# Patient Record
Sex: Male | Born: 1965
Health system: Southern US, Community
[De-identification: ages and names within clinical notes are randomized; demographics above are authoritative.]

---

## 2019-07-21 ENCOUNTER — Emergency Department (HOSPITAL_COMMUNITY): Payer: No Typology Code available for payment source

## 2019-07-21 ENCOUNTER — Other Ambulatory Visit: Payer: Self-pay

## 2019-07-21 ENCOUNTER — Inpatient Hospital Stay (HOSPITAL_COMMUNITY)
Admission: EM | Admit: 2019-07-21 | Discharge: 2019-08-05 | DRG: 163 | Disposition: A | Discharge status: Home / Self Care | Payer: No Typology Code available for payment source | Attending: Cardiothoracic Surgery | Admitting: Cardiothoracic Surgery

## 2019-07-21 ENCOUNTER — Inpatient Hospital Stay (HOSPITAL_COMMUNITY): Payer: No Typology Code available for payment source

## 2019-07-21 DIAGNOSIS — D72829 Elevated white blood cell count, unspecified: Secondary | ICD-10-CM | POA: Diagnosis not present

## 2019-07-21 DIAGNOSIS — J90 Pleural effusion, not elsewhere classified: Secondary | ICD-10-CM | POA: Diagnosis not present

## 2019-07-21 DIAGNOSIS — J930 Spontaneous tension pneumothorax: Principal | ICD-10-CM | POA: Diagnosis present

## 2019-07-21 DIAGNOSIS — I10 Essential (primary) hypertension: Secondary | ICD-10-CM | POA: Diagnosis present

## 2019-07-21 DIAGNOSIS — J850 Gangrene and necrosis of lung: Secondary | ICD-10-CM | POA: Diagnosis present

## 2019-07-21 DIAGNOSIS — J9311 Primary spontaneous pneumothorax: Secondary | ICD-10-CM | POA: Diagnosis not present

## 2019-07-21 DIAGNOSIS — F17200 Nicotine dependence, unspecified, uncomplicated: Secondary | ICD-10-CM | POA: Diagnosis present

## 2019-07-21 DIAGNOSIS — J948 Other specified pleural conditions: Secondary | ICD-10-CM

## 2019-07-21 DIAGNOSIS — J9383 Other pneumothorax: Secondary | ICD-10-CM | POA: Diagnosis not present

## 2019-07-21 DIAGNOSIS — Z79899 Other long term (current) drug therapy: Secondary | ICD-10-CM | POA: Diagnosis not present

## 2019-07-21 DIAGNOSIS — Z20822 Contact with and (suspected) exposure to covid-19: Secondary | ICD-10-CM | POA: Diagnosis present

## 2019-07-21 DIAGNOSIS — Z23 Encounter for immunization: Secondary | ICD-10-CM

## 2019-07-21 DIAGNOSIS — I480 Paroxysmal atrial fibrillation: Secondary | ICD-10-CM | POA: Diagnosis present

## 2019-07-21 DIAGNOSIS — J852 Abscess of lung without pneumonia: Secondary | ICD-10-CM

## 2019-07-21 DIAGNOSIS — Z09 Encounter for follow-up examination after completed treatment for conditions other than malignant neoplasm: Secondary | ICD-10-CM

## 2019-07-21 DIAGNOSIS — R079 Chest pain, unspecified: Secondary | ICD-10-CM

## 2019-07-21 DIAGNOSIS — G8929 Other chronic pain: Secondary | ICD-10-CM | POA: Diagnosis not present

## 2019-07-21 DIAGNOSIS — J9382 Other air leak: Secondary | ICD-10-CM | POA: Diagnosis not present

## 2019-07-21 DIAGNOSIS — I2510 Atherosclerotic heart disease of native coronary artery without angina pectoris: Secondary | ICD-10-CM | POA: Diagnosis not present

## 2019-07-21 DIAGNOSIS — J432 Centrilobular emphysema: Secondary | ICD-10-CM | POA: Diagnosis not present

## 2019-07-21 DIAGNOSIS — Z720 Tobacco use: Secondary | ICD-10-CM | POA: Diagnosis not present

## 2019-07-21 DIAGNOSIS — J939 Pneumothorax, unspecified: Secondary | ICD-10-CM | POA: Diagnosis present

## 2019-07-21 LAB — CBC WITH DIFFERENTIAL/PLATELET
Abs Immature Granulocytes: 0.06 10*3/uL (ref 0.00–0.07)
Basophils Absolute: 0 10*3/uL (ref 0.0–0.1)
Basophils Relative: 0 %
Eosinophils Absolute: 0 10*3/uL (ref 0.0–0.5)
Eosinophils Relative: 0 %
HCT: 45.9 % (ref 39.0–52.0)
Hemoglobin: 14.9 g/dL (ref 13.0–17.0)
Immature Granulocytes: 0 %
Lymphocytes Relative: 8 %
Lymphs Abs: 1.2 10*3/uL (ref 0.7–4.0)
MCH: 31.7 pg (ref 26.0–34.0)
MCHC: 32.5 g/dL (ref 30.0–36.0)
MCV: 97.7 fL (ref 80.0–100.0)
Monocytes Absolute: 0.8 10*3/uL (ref 0.1–1.0)
Monocytes Relative: 5 %
Neutro Abs: 12.5 10*3/uL — ABNORMAL HIGH (ref 1.7–7.7)
Neutrophils Relative %: 87 %
Platelets: 74 10*3/uL — ABNORMAL LOW (ref 150–400)
RBC: 4.7 MIL/uL (ref 4.22–5.81)
RDW: 11.9 % (ref 11.5–15.5)
WBC: 14.6 10*3/uL — ABNORMAL HIGH (ref 4.0–10.5)
nRBC: 0 % (ref 0.0–0.2)

## 2019-07-21 LAB — URINALYSIS, ROUTINE W REFLEX MICROSCOPIC
Bilirubin Urine: NEGATIVE
Glucose, UA: NEGATIVE mg/dL
Hgb urine dipstick: NEGATIVE
Ketones, ur: NEGATIVE mg/dL
Leukocytes,Ua: NEGATIVE
Nitrite: NEGATIVE
Protein, ur: NEGATIVE mg/dL
Specific Gravity, Urine: 1.016 (ref 1.005–1.030)
pH: 5 (ref 5.0–8.0)

## 2019-07-21 LAB — CBC
HCT: 44.9 % (ref 39.0–52.0)
Hemoglobin: 15 g/dL (ref 13.0–17.0)
MCH: 32.2 pg (ref 26.0–34.0)
MCHC: 33.4 g/dL (ref 30.0–36.0)
MCV: 96.4 fL (ref 80.0–100.0)
Platelets: 199 K/uL (ref 150–400)
RBC: 4.66 MIL/uL (ref 4.22–5.81)
RDW: 12 % (ref 11.5–15.5)
WBC: 17 K/uL — ABNORMAL HIGH (ref 4.0–10.5)
nRBC: 0 % (ref 0.0–0.2)

## 2019-07-21 LAB — I-STAT CHEM 8, ED
BUN: 13 mg/dL (ref 6–20)
Calcium, Ion: 1.14 mmol/L — ABNORMAL LOW (ref 1.15–1.40)
Chloride: 106 mmol/L (ref 98–111)
Creatinine, Ser: 0.9 mg/dL (ref 0.61–1.24)
Glucose, Bld: 97 mg/dL (ref 70–99)
HCT: 45 % (ref 39.0–52.0)
Hemoglobin: 15.3 g/dL (ref 13.0–17.0)
Potassium: 4.3 mmol/L (ref 3.5–5.1)
Sodium: 140 mmol/L (ref 135–145)
TCO2: 29 mmol/L (ref 22–32)

## 2019-07-21 LAB — HIV ANTIBODY (ROUTINE TESTING W REFLEX): HIV Screen 4th Generation wRfx: NONREACTIVE

## 2019-07-21 LAB — SARS CORONAVIRUS 2 (TAT 6-24 HRS): SARS Coronavirus 2: NEGATIVE

## 2019-07-21 MED ORDER — HYDROCODONE-ACETAMINOPHEN 5-325 MG PO TABS
1.0000 | ORAL_TABLET | ORAL | Status: DC | PRN
  Administered 2019-07-21 (×2): 2 via ORAL
  Filled 2019-07-21 (×2): qty 2

## 2019-07-21 MED ORDER — ONDANSETRON HCL 4 MG/2ML IJ SOLN
4.0000 mg | Freq: Once | INTRAMUSCULAR | Status: AC
  Administered 2019-07-21: 4 mg via INTRAVENOUS
  Filled 2019-07-21: qty 2

## 2019-07-21 MED ORDER — DEXTROSE IN LACTATED RINGERS 5 % IV SOLN
INTRAVENOUS | Status: DC
  Administered 2019-07-22: 75 mL/h via INTRAVENOUS

## 2019-07-21 MED ORDER — LIDOCAINE HCL (PF) 1 % IJ SOLN
INTRAMUSCULAR | Status: AC
  Filled 2019-07-21: qty 5

## 2019-07-21 MED ORDER — MORPHINE SULFATE (PF) 2 MG/ML IV SOLN
2.0000 mg | INTRAVENOUS | Status: DC | PRN
  Administered 2019-07-21 – 2019-07-22 (×10): 4 mg via INTRAVENOUS
  Administered 2019-07-23: 2 mg via INTRAVENOUS
  Administered 2019-07-23: 4 mg via INTRAVENOUS
  Administered 2019-07-24 – 2019-07-30 (×11): 2 mg via INTRAVENOUS
  Administered 2019-07-30: 4 mg via INTRAVENOUS
  Administered 2019-08-01 – 2019-08-03 (×3): 2 mg via INTRAVENOUS
  Filled 2019-07-21: qty 1
  Filled 2019-07-21: qty 2
  Filled 2019-07-21: qty 1
  Filled 2019-07-21: qty 2
  Filled 2019-07-21 (×2): qty 1
  Filled 2019-07-21: qty 2
  Filled 2019-07-21 (×2): qty 1
  Filled 2019-07-21: qty 2
  Filled 2019-07-21 (×2): qty 1
  Filled 2019-07-21: qty 2
  Filled 2019-07-21: qty 1
  Filled 2019-07-21: qty 2
  Filled 2019-07-21 (×6): qty 1
  Filled 2019-07-21 (×3): qty 2
  Filled 2019-07-21 (×2): qty 1
  Filled 2019-07-21 (×3): qty 2

## 2019-07-21 MED ORDER — FENTANYL CITRATE (PF) 100 MCG/2ML IJ SOLN
INTRAMUSCULAR | Status: AC
  Administered 2019-07-21: 100 ug
  Filled 2019-07-21: qty 2

## 2019-07-21 MED ORDER — HYDROMORPHONE HCL 1 MG/ML IJ SOLN
1.0000 mg | Freq: Once | INTRAMUSCULAR | Status: AC
  Administered 2019-07-21: 1 mg via INTRAVENOUS
  Filled 2019-07-21: qty 1

## 2019-07-21 MED ORDER — LIDOCAINE-EPINEPHRINE (PF) 2 %-1:200000 IJ SOLN
INTRAMUSCULAR | Status: AC
  Filled 2019-07-21: qty 20

## 2019-07-21 NOTE — Progress Notes (Addendum)
Patient admitted to room 2W03 s/p spontaneous rignt pneumohemothorax resulting in placement of #14 chest tube to -20 cm H2O.  Patient introduced to staff and to unit routine.  Initial assessment completed and CHG bath performed.  Call bell left within reach and bed in low position with bed alarm activated for safety.

## 2019-07-21 NOTE — ED Provider Notes (Signed)
[facility] EMERGENCY DEPARTMENT Provider Note   CSN: [id number] Arrival date & time:        History Chief Complaint  Patient presents with  . Back Pain    Caleb Young. is a 54 y.o. male.  The history is provided by the patient. No language interpreter was used.  Back Pain    54 year old male brought here via EMS from home for evaluation of flank pain.  Patient report acute onset of severe pain to his right side of his back radiates towards the shoulder blade and down to the right side of his body.  Pain is sharp, severe, nothing seems to make it better or worse.  No report of fever chills no lightheadedness or dizziness no significant chest pain no dysuria or hematuria.  States he is hurting everywhere from right side starting from his right shoulder down to his right lower abdomen.  Endorsed feeling like he is choking.  Denies any recent injury.  Denies any focal numbness or focal weakness.  No prior history of kidney stones but did report having history of cyst in his kidney.  No history of AAA.  He received 100 mcg of fentanyl prior to arrival without adequate relief.  No past medical history on file.  There are no problems to display for this patient.   The histories are not reviewed yet. Please review them in the "History" navigator section and refresh this SmartLink.     No family history on file.  Social History   Tobacco Use  . Smoking status: Not on file  Substance Use Topics  . Alcohol use: Not on file  . Drug use: Not on file    Home Medications Prior to Admission medications   Not on File    Allergies    Codeine and Gabapentin  Review of Systems   Review of Systems  Musculoskeletal: Positive for back pain.  All other systems reviewed and are negative.   Physical Exam Updated Vital Signs BP (!) 149/110 (BP Location: Right Arm)   Pulse (!) 110   Temp 98.2 F (36.8 C) (Tympanic)   Resp 20   SpO2 93%   Physical  Exam Vitals and nursing note reviewed.  Constitutional:      General: He is in acute distress (Patient appears very uncomfortable, writhing around in bed moaning.).     Appearance: He is well-developed.  HENT:     Head: Atraumatic.  Eyes:     Conjunctiva/sclera: Conjunctivae normal.  Cardiovascular:     Rate and Rhythm: Normal rate and regular rhythm.     Pulses: Normal pulses.     Heart sounds: Normal heart sounds.  Pulmonary:     Breath sounds: No wheezing, rhonchi or rales.     Comments: Tachypnea with decreased breath sounds right lung compared to left. Abdominal:     Palpations: Abdomen is soft.     Tenderness: There is abdominal tenderness (Diffuse abdominal tenderness without focal point tenderness.). There is right CVA tenderness. There is no left CVA tenderness.  Musculoskeletal:        General: Normal range of motion.     Cervical back: Neck supple.     Comments: Moving all 4 extremities.  Skin:    Capillary Refill: Capillary refill takes less than 2 seconds.     Findings: No rash.  Neurological:     Mental Status: He is alert and oriented to person, place, and time.  Psychiatric:  Mood and Affect: Mood normal.     ED Results / Procedures / Treatments   Labs (all labs ordered are listed, but only abnormal results are displayed) Labs Reviewed  CBC WITH DIFFERENTIAL/PLATELET - Abnormal; Notable for the following components:      Result Value   WBC 14.6 (*)    Platelets 74 (*)    Neutro Abs 12.5 (*)    All other components within normal limits  I-STAT CHEM 8, ED - Abnormal; Notable for the following components:   Calcium, Ion 1.14 (*)    All other components within normal limits  URINALYSIS, ROUTINE W REFLEX MICROSCOPIC    EKG EKG Interpretation  Date/Time:  Wednesday July 21 2019 10:32:01 EST Ventricular Rate:  110 PR Interval:    QRS Duration: 108 QT Interval:  330 QTC Calculation: 443 R Axis:   83 Text Interpretation: Sinus tachycardia  Nonspecific T abnormalities, lateral leads No old tracing to compare Confirmed by Dorie Rank [phone]) on 07/21/2019 10:36:18 AM   Radiology CT Renal Stone Study  Result Date: 07/21/2019 CLINICAL DATA:  54 year old male with history of flank pain. Suspected kidney stone. EXAM: CT ABDOMEN AND PELVIS WITHOUT CONTRAST TECHNIQUE: Multidetector CT imaging of the abdomen and pelvis was performed following the standard protocol without IV contrast. COMPARISON:  No priors. FINDINGS: Lower chest: Large right pneumothorax with near complete collapse of the visualized right lung. Trace volume of right-sided pleural fluid lying dependently. There may be some mild mass effect on cardiomediastinal structures which appears slightly shifted to the left. Aortic atherosclerosis. Calcified atherosclerotic plaque also noted in the left circumflex coronary artery. Hepatobiliary: No definite suspicious cystic or solid hepatic lesions are confidently identified on today's noncontrast CT examination. Atrophy of the left lobe of the liver. Calcified granuloma in the central liver. Unenhanced appearance of the gallbladder is unremarkable. Pancreas: No definite pancreatic mass or peripancreatic fluid collections or inflammatory changes are noted on today's noncontrast CT examination. Spleen: Unremarkable. Adrenals/Urinary Tract: No calculi are identified within the collecting system of either kidney, along the course of either ureter, or within the lumen of the urinary bladder. No hydroureteronephrosis. 3.3 x 2.8 x 3.1 cm low-attenuation lesion in the interpolar region of the right kidney, incompletely characterized on today's non-contrast CT examination, but statistically likely to represent a cyst. Unenhanced appearance of the left kidney and bilateral adrenal glands is unremarkable. Unenhanced appearance of the urinary bladder is normal. Stomach/Bowel: Unenhanced appearance of the stomach is normal. No pathologic dilatation of small bowel  or colon. Normal appendix. Vascular/Lymphatic: Aortic atherosclerosis. No lymphadenopathy noted in the abdomen or pelvis. Reproductive: Prostate gland and seminal vesicles are unremarkable in appearance. Other: No significant volume of ascites.  No pneumoperitoneum. Musculoskeletal: There are no aggressive appearing lytic or blastic lesions noted in the visualized portions of the skeleton. IMPRESSION: 1. Large right-sided hydropneumothorax (large pneumothorax with trace volume of fluid) which appears likely under tension. 2. No acute findings are noted in the abdomen or pelvis to account for the patient's symptoms. Specifically, no urinary tract calculi and no findings of urinary tract obstruction are noted at this time. 3. Aortic atherosclerosis, in addition to at least left circumflex coronary artery disease. Please note that although the presence of coronary artery calcium documents the presence of coronary artery disease, the severity of this disease and any potential stenosis cannot be assessed on this non-gated CT examination. Assessment for potential risk factor modification, dietary therapy or pharmacologic therapy may be warranted, if clinically indicated. Critical Value/emergent  results were called by telephone at the time of interpretation on 07/21/2019 at 11:33 am to provider [facility], who verbally acknowledged these results. Electronically Signed   By: Trudie Reed M.D.   On: 07/21/2019 11:33    Procedures .Critical Care Performed by: Fayrene Helper, PA-C Authorized by: Fayrene Helper, PA-C   Critical care provider statement:    Critical care time (minutes):  31   Critical care was time spent personally by me on the following activities:  Discussions with consultants, evaluation of patient's response to treatment, examination of patient, ordering and performing treatments and interventions, ordering and review of laboratory studies, ordering and review of radiographic studies, pulse oximetry,  re-evaluation of patient's condition, obtaining history from patient or surrogate and review of old charts   (including critical care time)  EMERGENCY DEPARTMENT [place] FAST EXAM "Limited Ultrasound of the Abdomen and Pericardium" (FAST Exam).   INDICATIONS:No abdominal aortic aneurysm Multiple views of the abdomen and pericardium are obtained with a multi-frequency probe.  PERFORMED BY: Myself IMAGES ARCHIVED?: Yes LIMITATIONS:  Emergent procedure INTERPRETATION:  No abdominal free fluid and No pericardial effusion   EMERGENCY DEPARTMENT ULTRASOUND  Study: Limited Retroperitoneal Ultrasound of the Abdominal Aorta.  INDICATIONS:Abdominal pain Multiple views of the abdominal aorta were obtained in real-time from the diaphragmatic hiatus to the aortic bifurcation in transverse planes with a multi-frequency probe.  PERFORMED BY: Myself IMAGES ARCHIVED?: Yes LIMITATIONS:  Abdominal pain INTERPRETATION:  No abdominal aortic aneurysm      Medications Ordered in ED Medications  lidocaine (PF) (XYLOCAINE) 1 % injection (has no administration in time range)  lidocaine-EPINEPHrine (XYLOCAINE W/EPI) 2 %-1:200000 (PF) injection (has no administration in time range)  HYDROmorphone (DILAUDID) injection 1 mg (1 mg Intravenous Given 07/21/19 1049)  ondansetron (ZOFRAN) injection 4 mg (4 mg Intravenous Given 07/21/19 1049)  HYDROmorphone (DILAUDID) injection 1 mg (1 mg Intravenous Given 07/21/19 1119)  fentaNYL (SUBLIMAZE) 100 MCG/2ML injection (100 mcg  Given 07/21/19 1201)    ED Course  I have reviewed the triage vital signs and the nursing notes.  Pertinent labs & imaging results that were available during my care of the patient were reviewed by me and considered in my medical decision making (see chart for details).    MDM Rules/Calculators/A&P                      BP (!) 149/110 (BP Location: Right Arm)   Pulse (!) 110   Temp 98.2 F (36.8 C) (Tympanic)   Resp 20   SpO2 93%   Final  Clinical Impression(s) / ED Diagnoses Final diagnoses:  Spontaneous tension pneumothorax    Rx / DC Orders ED Discharge Orders    None     10:42 AM Patient here with acute onset of right flank pain suggestive of potential kidney stone.  Appears very uncomfortable, will provide pain medication.  Will check UA, obtain CT renal stone study.  CAre discussed with DR. Knapp.  He does have diminished lung sounds, will appreciate.  11:00 AM With exquisite abdominal tenderness radiates to his shoulder, I perform fast ultrasound as well as evaluate for aorta without any evidence of free fluid and no obvious AAA.  CT renal stone study obtained.  Patient required multiple doses of pain medication for symptomatic relief.  12:07 PM CT scan shows large right-sided hydropneumothorax suggestive of tension pneumothorax.  No renal stone noted.  This was probably communicated with Dr. Lynelle Doctor, as well as critical care who agrees  to admit patient.  Critical care specialist will also insert chest tube for therapeutic relief.  Suspect this is likely a spontaneous pneumothorax from a ruptured bleb.  Patient does have history of tobacco use.  Patient felt much better after insertion of chest tube.  He will be admitted to critical care.  covid-19 screening test ordered.   Caleb Young. was evaluated in Emergency Department on 07/21/2019 for the symptoms described in the history of present illness. He was evaluated in the context of the global COVID-19 pandemic, which necessitated consideration that the patient might be at risk for infection with the SARS-CoV-2 virus that causes COVID-19. Institutional protocols and algorithms that pertain to the evaluation of patients at risk for COVID-19 are in a state of rapid change based on information released by regulatory bodies including the CDC and federal and state organizations. These policies and algorithms were followed during the patient's care in the ED.    Fayrene Helper, PA-C 07/28/19 1937    Linwood Dibbles, MD 07/29/19 [phone]

## 2019-07-21 NOTE — Procedures (Signed)
Chest Tube Insertion Procedure Note  Indications:  Clinically significant Pneumothorax  Pre-operative Diagnosis: Pneumothorax  Post-operative Diagnosis: Pneumothorax  Procedure Details  Informed consent was obtained for the procedure, including sedation.  Risks of lung perforation, hemorrhage, arrhythmia, and adverse drug reaction were discussed.   After sterile skin prep, using standard technique, a 14 French tube was placed in the right lateral 5th rib space.  Findings: Gush of air, small amount of blood.  Estimated Blood Loss:  Minimal         Specimens:  None.              Complications:  None; patient tolerated the procedure well.         Disposition: SDU.         Condition: stable.   Rutherford Guys, [place] [facility] 07/21/2019, 12:14 PM    Attending Attestation: I was present for the entire procedure.

## 2019-07-21 NOTE — ED Provider Notes (Signed)
Medical screening examination/treatment/procedure(s) were conducted as a shared visit with non-physician practitioner(s) and myself.  I personally evaluated the patient during the encounter.  EKG Interpretation  Date/Time:  Wednesday July 21 2019 10:32:01 EST Ventricular Rate:  110 PR Interval:    QRS Duration: 108 QT Interval:  330 QTC Calculation: 443 R Axis:   83 Text Interpretation: Sinus tachycardia Nonspecific T abnormalities, lateral leads No old tracing to compare Confirmed by Linwood Dibbles [phone]) on 07/21/2019 10:36:18 AM  Pt presents with acute flank pain.  Pt has noted some pain in his chest.  On exam, decreased breath sounds on the right.  CT scan shows a pneumothorax.  CT findings concerning for tension physiology.  Pt is tachycardic but not hypotensive.  Will hold off on needle decompression at this time and proceed with chest tube.  Discussed with PCCM  Who will manage pt, place chest tube.  Equipment is at the bedside.  Plan discussed with patient.    Linwood Dibbles, MD 07/21/19 1146

## 2019-07-21 NOTE — H&P (Signed)
NAME:  Caleb Overbeck., MRN:  [id number], DOB:  [birth year], LOS: 0 ADMISSION DATE:  07/21/2019, CONSULTATION DATE:  3/3 REFERRING MD:  Tomi Bamberger, CHIEF COMPLAINT:  Tension PTX   Brief History   54 year old male admitted 3/3 w/ right tension ptx.   History of present illness   This is a 54 year old white male who presented to the emergency room In the a.m. of 3/3 with chief complaint of severe right back pain migrating up through the shoulder blades.  On exam he had decreased breath sounds on the right. Dx eval c/w tension PTX. PCCM asked to eval.  Past Medical History  Smoker   Significant Hospital Events   3/3 admitted small bore CT placed in right  Consults:    Procedures:  Right lateral chest tube 3/3>>>  Significant Diagnostic Tests:   zT chest 3/3 1. Large right-sided hydropneumothorax (large pneumothorax with trace volume of fluid) which appears likely under tension.2. No acute findings are noted in the abdomen or pelvis to account for the patient's symptoms. Specifically, no urinary tract calculi and no findings of urinary tract obstruction are noted at this time.3. Aortic atherosclerosis, in addition to at least left circumflex coronary artery disease. Please note that although the presence of coronary artery calcium documents the presence of coronary artery disease, the severity of this disease and any potential stenosis cannot be assessed on this non-gated CT examination Micro Data:   Antimicrobials:    Interim history/subjective:  Right sided back and flank pain   Objective   Blood pressure 134/86, pulse (Abnormal) 121, temperature 98.2 F (36.8 C), temperature source Tympanic, resp. rate (Abnormal) 22, SpO2 93 %.       No intake or output data in the 24 hours ending 07/21/19 1153 There were no vitals filed for this visit.  Examination: General: 54 year old white male was in acute distress  HENT: NCAT no JVD MMM Lungs: decreased on right + accessory use   Cardiovascular: tachy RRR  Abdomen: soft not tender  Extremities: warm and dry no edema  Neuro: awake and alert  GU: due to void   Resolved Hospital Problem list     Assessment & Plan:  Acute spontaneous right ptx  -no evidence of acute bronchitis at this point or other etiology Plan CT to sxn Serial CXR Analgesia  Tobacco abuse Plan Smoking cessation  Mild leukocytosis Plan Trend fever curve   Best practice:  Diet: reg Pain/Anxiety/Delirium protocol (if indicated): PRN analgesia  VAP protocol (if indicated): NA DVT prophylaxis:[place] heparin  GI prophylaxis: NA Glucose control: NA Mobility: OOB  Code Status: full code  Family Communication: pending  Disposition:  To progressive care unit  Labs   CBC: Recent Labs  Lab 07/21/19 1042 07/21/19 1102  WBC 14.6*  --   NEUTROABS 12.5*  --   HGB 14.9 15.3  HCT 45.9 45.0  MCV 97.7  --   PLT 74*  --     Basic Metabolic Panel: Recent Labs  Lab 07/21/19 1102  NA 140  K 4.3  CL 106  GLUCOSE 97  BUN 13  CREATININE 0.90   GFR: CrCl cannot be calculated (Unknown ideal weight.). Recent Labs  Lab 07/21/19 1042  WBC 14.6*    Liver Function Tests: No results for input(s): AST, ALT, ALKPHOS, BILITOT, PROT, ALBUMIN in the last 168 hours. No results for input(s): LIPASE, AMYLASE in the last 168 hours. No results for input(s): AMMONIA in the last 168 hours.  ABG  Component Value Date/Time   TCO2 29 07/21/2019 1102     Coagulation Profile: No results for input(s): INR, PROTIME in the last 168 hours.  Cardiac Enzymes: No results for input(s): CKTOTAL, CKMB, CKMBINDEX, TROPONINI in the last 168 hours.  HbA1C: No results found for: HGBA1C  CBG: No results for input(s): GLUCAP in the last 168 hours.  Review of Systems:   Review of Systems  Constitutional: Negative.   HENT: Negative.   Eyes: Negative.   Respiratory: Positive for shortness of breath.   Cardiovascular: Positive for chest pain.   Gastrointestinal: Negative.   Genitourinary: Negative.   Musculoskeletal: Positive for back pain.  Skin: Negative.   Neurological: Negative.   Endo/Heme/Allergies: Negative.   Psychiatric/Behavioral: Negative.      Past Medical History  He,  has no past medical history on file.   Surgical History      Social History     Smoker  Family History   His family history is not on file.   Allergies Allergies  Allergen Reactions  . Codeine     "made my heart stop"  . Gabapentin Palpitations    tremors     Home Medications  Prior to Admission medications   Not on File     Critical care time: NA     Simonne Martinet ACNP-BC [facility] Pulmonary/Critical Care Pager # [phone] OR # [phone] if no answer

## 2019-07-21 NOTE — ED Triage Notes (Signed)
Pt woke up this morning at 0715 with severe back pain between his shoulder blades. Pain has continued, but on arrival is having pain on his right mid back "all the way around my body."

## 2019-07-21 NOTE — Progress Notes (Signed)
   07/21/19 1839  Vitals  Temp 98.6 F (37 C)  Temp Source Oral  BP 129/81  MAP (mmHg) 96  BP Location Left Arm  BP Method Automatic  Patient Position (if appropriate) Lying  Pulse Rate 100  Pulse Rate Source Dinamap  ECG Heart Rate (!) 101  Resp (!) 23  Level of Consciousness  Level of Consciousness Alert  Oxygen Therapy  SpO2 94 %  O2 Device Nasal Cannula  O2 Flow Rate (L/min) 2 L/min  MEWS Assessment  Is this an acute change? No   Aforementioned vital signs are typical for patient since arrival with severe thoracic pain right chest s/p chest tube insertion.  MD aware.  Medicated accordingly.

## 2019-07-22 ENCOUNTER — Inpatient Hospital Stay (HOSPITAL_COMMUNITY): Payer: No Typology Code available for payment source

## 2019-07-22 ENCOUNTER — Encounter (HOSPITAL_COMMUNITY): Payer: Self-pay | Admitting: Internal Medicine

## 2019-07-22 DIAGNOSIS — J930 Spontaneous tension pneumothorax: Secondary | ICD-10-CM

## 2019-07-22 DIAGNOSIS — Z720 Tobacco use: Secondary | ICD-10-CM

## 2019-07-22 DIAGNOSIS — J9383 Other pneumothorax: Secondary | ICD-10-CM

## 2019-07-22 DIAGNOSIS — J432 Centrilobular emphysema: Secondary | ICD-10-CM

## 2019-07-22 HISTORY — DX: Spontaneous tension pneumothorax: J93.0

## 2019-07-22 LAB — COMPREHENSIVE METABOLIC PANEL
ALT: 15 U/L (ref 0–44)
AST: 23 U/L (ref 15–41)
Albumin: 3.4 g/dL — ABNORMAL LOW (ref 3.5–5.0)
Alkaline Phosphatase: 49 U/L (ref 38–126)
Anion gap: 11 (ref 5–15)
BUN: 11 mg/dL (ref 6–20)
CO2: 23 mmol/L (ref 22–32)
Calcium: 8.7 mg/dL — ABNORMAL LOW (ref 8.9–10.3)
Chloride: 101 mmol/L (ref 98–111)
Creatinine, Ser: 0.78 mg/dL (ref 0.61–1.24)
GFR calc Af Amer: >60 mL/min (ref >60)
GFR calc non Af Amer: >60 mL/min (ref >60)
Glucose, Bld: 126 mg/dL — ABNORMAL HIGH (ref 70–99)
Potassium: 3.7 mmol/L (ref 3.5–5.1)
Sodium: 135 mmol/L (ref 135–145)
Total Bilirubin: 1.2 mg/dL (ref 0.3–1.2)
Total Protein: 6.1 g/dL — ABNORMAL LOW (ref 6.5–8.1)

## 2019-07-22 LAB — CBC
HCT: 41.4 % (ref 39.0–52.0)
Hemoglobin: 14.2 g/dL (ref 13.0–17.0)
MCH: 32.8 pg (ref 26.0–34.0)
MCHC: 34.3 g/dL (ref 30.0–36.0)
MCV: 95.6 fL (ref 80.0–100.0)
Platelets: 193 10*3/uL (ref 150–400)
RBC: 4.33 MIL/uL (ref 4.22–5.81)
RDW: 11.9 % (ref 11.5–15.5)
WBC: 21.1 10*3/uL — ABNORMAL HIGH (ref 4.0–10.5)
nRBC: 0 % (ref 0.0–0.2)

## 2019-07-22 MED ORDER — PANTOPRAZOLE SODIUM 40 MG PO TBEC
40.0000 mg | DELAYED_RELEASE_TABLET | Freq: Every day | ORAL | Status: AC
  Administered 2019-07-22 – 2019-07-24 (×2): 40 mg via ORAL
  Filled 2019-07-22 (×2): qty 1

## 2019-07-22 MED ORDER — KETOROLAC TROMETHAMINE 15 MG/ML IJ SOLN
15.0000 mg | Freq: Four times a day (QID) | INTRAMUSCULAR | Status: AC
  Administered 2019-07-22 – 2019-07-23 (×4): 15 mg via INTRAVENOUS
  Filled 2019-07-22 (×4): qty 1

## 2019-07-22 MED ORDER — SIMETHICONE 80 MG PO CHEW
80.0000 mg | CHEWABLE_TABLET | Freq: Four times a day (QID) | ORAL | Status: DC | PRN
  Administered 2019-07-22 – 2019-07-30 (×7): 80 mg via ORAL
  Filled 2019-07-22 (×7): qty 1

## 2019-07-22 MED ORDER — MORPHINE SULFATE (PF) 2 MG/ML IV SOLN: 2.0000 mg | Freq: Once | INTRAVENOUS | Status: DC

## 2019-07-22 MED ORDER — IBUPROFEN 600 MG PO TABS
600.0000 mg | ORAL_TABLET | Freq: Three times a day (TID) | ORAL | Status: DC
  Administered 2019-07-22: 600 mg via ORAL
  Filled 2019-07-22: qty 1

## 2019-07-22 MED ORDER — LIDOCAINE 5 % EX PTCH
1.0000 | MEDICATED_PATCH | CUTANEOUS | Status: DC
  Administered 2019-07-22 – 2019-08-04 (×12): 1 via TRANSDERMAL
  Filled 2019-07-22 (×12): qty 1

## 2019-07-22 MED ORDER — HYDROMORPHONE HCL 1 MG/ML IJ SOLN
1.0000 mg | Freq: Once | INTRAMUSCULAR | Status: AC
  Administered 2019-07-22: 1 mg via INTRAVENOUS
  Filled 2019-07-22: qty 1

## 2019-07-22 MED ORDER — IBUPROFEN 200 MG PO TABS: 400.0000 mg | ORAL_TABLET | Freq: Once | ORAL | Status: DC

## 2019-07-22 NOTE — TOC Initial Note (Signed)
Transition of Care (TOC) - Initial/Assessment Note    Patient Details  Name: Caleb Young. MRN: [id number] Date of Birth: 1965/10/11  Transition of Care [facility]) CM/SW Contact:    Kermit Balo, RN Phone Number: 07/22/2019, 2:25 PM  Clinical Narrative:                 [facility] [place]. His PCP: Dr Liliane Shi. [facility] his [facility]. At d/c medications will need to be filled either through [facility] or outpatient pharmacy if over the weekend as the [place] pharmacy is also closed over the weekend. [place]: [phone] or [phone] ext [phone] TOC following for d/c needs.   Expected Discharge Plan: Home/Self Care Barriers to Discharge: Continued Medical Work up   Patient Goals and CMS Choice        Expected Discharge Plan and Services Expected Discharge Plan: Home/Self Care   Discharge Planning Services: CM Consult   Living arrangements for the past 2 months: Mobile Home                                      Prior Living Arrangements/Services Living arrangements for the past 2 months: Mobile Home Lives with:: Spouse Patient language and need for interpreter reviewed:: Yes Do you feel safe going back to the place where you live?: Yes      Need for Family Participation in Patient Care: Yes (Comment) Care giver support system in place?: Yes (comment)(spouse)   Criminal Activity/Legal Involvement Pertinent to Current Situation/Hospitalization: No - Comment as needed  Activities of Daily Living      Permission Sought/Granted                  Emotional Assessment Appearance:: Appears stated age Attitude/Demeanor/Rapport: Engaged Affect (typically observed): Accepting Orientation: : Oriented to Self, Oriented to  Time, Oriented to Place, Oriented to Situation   Psych Involvement: No (comment)  Admission diagnosis:  Spontaneous tension pneumothorax [J93.0] Tension pneumothorax  [J93.0] Pneumothorax [J93.9] Patient Active Problem List   Diagnosis Date Noted  . Tension pneumothorax 07/21/2019  . Pneumothorax    PCP:  [facility], [facility]:   [facility] [place], [place] - [address] [phone] [facility] [place] [address] Phone: [phone] Fax: [phone]     Social Determinants of Health (SDOH) Interventions    Readmission Risk Interventions No flowsheet data found.

## 2019-07-22 NOTE — Progress Notes (Addendum)
   NAME:  Caleb Stemm., MRN:  [id number], DOB:  1965/08/22, LOS: 1 ADMISSION DATE:  07/21/2019, CONSULTATION DATE:  Admitted 3/4 REFERRING MD:  ED, CHIEF COMPLAINT:  Spontaneous ptx  Brief History   Is a 54 year old gentleman who follows at the [place] who presented with spontaneous tension pneumothorax likely secondary ruptured blood.  He was seen in the ED by her team and a right-sided pigtail chest tube drainage system was placed with resolution of his impending tension physiology.  Consults:    Procedures:  3/3 right-sided pigtail chest tube placed  Significant Diagnostic Tests:    Micro Data:    Antimicrobials:   Interim history/subjective:  Writhing in pain this morning.  Chest tube maintained to suction overnight.  Objective   Blood pressure 102/75, pulse 100, temperature 99.2 F (37.3 C), temperature source Oral, resp. rate (!) 23, height 6\' 4"  (1.93 m), weight 79.5 kg, SpO2 93 %.        Intake/Output Summary (Last 24 hours) at 07/22/2019 1003 Last data filed at 07/22/2019 0536 Gross per 24 hour  Intake 823.8 ml  Output 729 ml  Net 94.8 ml   Filed Weights   07/21/19 1219 07/21/19 1830  Weight: 79.8 kg 79.5 kg    Examination: General: chronically ill appearing. Initially was comfortably talking on the phone, when I arrived began actively writhing in pain.  HENT: mmm poor dentition Lungs: diminished bilaterally no wheezes or crackles, able to complete full sentences. Right pigtail catheter attached to chest tube drainage system at suction. No air leak.  Cardiovascular: RRR no mrg Abdomen: scaphoid, soft Extremities: thin, no edema Neuro: normal speech, moves all 4 extremities  Chest xray reviewed this morning - trace apical right sided ptx noted.   Assessment & Plan:  Spontaneous tension pneumothorax Suspected COPD  Plan: Chest tube placed to waterseal at 10am. Will obtain follow up chest xray.  Scheduled NSAIDs for pain control, anti-inflammatory in the  setting of pleurisy from hemothorax. Given that this is his second spontaneous pneumothorax will consult [facility].   09/20/19, MD Pulmonary and Critical Care Medicine [facility] Pager: [phone] Office:[phone]  Labs    Chest xray   CBC: Recent Labs  Lab 07/21/19 1042 07/21/19 1102 07/21/19 1353 07/22/19 0109  WBC 14.6*  --  17.0* 21.1*  NEUTROABS 12.5*  --   --   --   HGB 14.9 15.3 15.0 14.2  HCT 45.9 45.0 44.9 41.4  MCV 97.7  --  96.4 95.6  PLT 74*  --  199 193    Basic Metabolic Panel: Recent Labs  Lab 07/21/19 1102 07/22/19 0109  NA 140 135  K 4.3 3.7  CL 106 101  CO2  --  23  GLUCOSE 97 126*  BUN 13 11  CREATININE 0.90 0.78  CALCIUM  --  8.7*   GFR: Estimated Creatinine Clearance: 118.7 mL/min (by C-G formula based on SCr of 0.78 mg/dL). Recent Labs  Lab 07/21/19 1042 07/21/19 1353 07/22/19 0109  WBC 14.6* 17.0* 21.1*    Liver Function Tests: Recent Labs  Lab 07/22/19 0109  AST 23  ALT 15  ALKPHOS 49  BILITOT 1.2  PROT 6.1*  ALBUMIN 3.4*   No results for input(s): LIPASE, AMYLASE in the last 168 hours. No results for input(s): AMMONIA in the last 168 hours.  ABG    Component Value Date/Time   TCO2 29 07/21/2019 1102

## 2019-07-22 NOTE — Consult Note (Signed)
[address]       [place] [address]             Sarepta Jr. [place] Record #[id number] Date of Birth: 1966/04/03  Referring: No ref. provider found [facility] Care: [facility] Cardiologist:No primary care provider on file.  Chief Complaint:    Chief Complaint  Patient presents with   Back Pain    History of Present Illness:     54 yo man with long h/o tobacco abuse presented 36 hrs ago with sudden onset back pain and evidence for right PTX. He was hemodynamically compromised as well, consistent with tension PTX. This was relieved by CT insertion in the ED. He continues to have residual PTX now 36 hours later; consult placed to CT surgery for VATS.   Current Activity/ Functional Status: Patient will be independent with mobility/ambulation, transfers, ADL's, IADL's.   Zubrod Score: At the time of surgery this patients most appropriate activity status/level should be described as: []     0    Normal activity, no symptoms []     1    Restricted in physical strenuous activity but ambulatory, able to do out light work []     2    Ambulatory and capable of self care, unable to do work activities, up and about                 more than 50%  Of the time                            []     3    Only limited self care, in bed greater than 50% of waking hours []     4    Completely disabled, no self care, confined to bed or chair []     5    Moribund  Past Medical History:  Diagnosis Date   Tension pneumothorax 07/22/2019    Social History   Tobacco Use  Smoking Status Not on file    Social History   Substance and Sexual Activity  Alcohol Use Not on file     Allergies  Allergen Reactions   Bee Venom Shortness Of Breath   Codeine     "made my heart stop"   Gabapentin Palpitations    tremors    Current Facility-Administered Medications  Medication Dose Route Frequency Provider Last Rate  Last Admin   ketorolac (TORADOL) 15 MG/ML injection 15 mg  15 mg Intravenous Q6H Gregor Dershem Z, MD   15 mg at 07/22/19 1750   lidocaine (LIDODERM) 5 % 1 patch  1 patch Transdermal Q24H Wonda Olds, MD   1 patch at 07/22/19 1747   morphine 2 MG/ML injection 2-4 mg  2-4 mg Intravenous Q3H PRN Erick Colace, NP   4 mg at 07/22/19 1443   pantoprazole (PROTONIX) EC tablet 40 mg  40 mg Oral Daily Spero Geralds, MD   40 mg at 07/22/19 1045   simethicone (MYLICON) chewable tablet 80 mg  80 mg Oral Q6H PRN Anders Simmonds, MD   80 mg at 07/22/19 1749    Medications Prior to Admission  Medication Sig Dispense Refill Last Dose   baclofen (LIORESAL) 10 MG tablet Take 5 mg by mouth at bedtime.   Past Week at Unknown time   Ensure (ENSURE) Take 237 mLs by mouth 3 (  three) times daily between meals.   07/20/2019 at Unknown time   loperamide (IMODIUM) 2 MG capsule Take 2 mg by mouth as needed for diarrhea or loose stools.   Past Month at Unknown time   losartan (COZAAR) 25 MG tablet Take 12.5 mg by mouth daily.   unk   tiZANidine (ZANAFLEX) 4 MG tablet Take 4 mg by mouth at bedtime.    Past Week at Unknown time    No family history on file.   Review of Systems:   ROS A comprehensive review of systems was negative.     Cardiac Review of Systems: Y or  [    ]= no  Chest Pain [    ]  Resting SOB [   ] Exertional SOB  [  ]  Orthopnea [  ]   Pedal Edema [   ]    Palpitations [  ] Syncope  [  ]   Presyncope [   ]  General Review of Systems: [Y] = yes [  ]=no Constitional: recent weight change [  ]; anorexia [  ]; fatigue [  ]; nausea [  ]; night sweats [  ]; fever [  ]; or chills [  ]                                                               Dental: Last Dentist visit:   Eye : blurred vision [  ]; diplopia [   ]; vision changes [  ];  Amaurosis fugax[  ]; Resp: cough [  ];  wheezing[  ];  hemoptysis[  ]; shortness of breath[  ]; paroxysmal nocturnal dyspnea[  ]; dyspnea on  exertion[  ]; or orthopnea[  ];  GI:  gallstones[  ], vomiting[  ];  dysphagia[  ]; melena[  ];  hematochezia [  ]; heartburn[  ];   Hx of  Colonoscopy[  ]; GU: kidney stones [  ]; hematuria[  ];   dysuria [  ];  nocturia[  ];  history of     obstruction [  ]; urinary frequency [  ]             Skin: rash, swelling[  ];, hair loss[  ];  peripheral edema[  ];  or itching[  ]; Musculosketetal: myalgias[  ];  joint swelling[  ];  joint erythema[  ];  joint pain[  ];  back pain[  ];  Heme/Lymph: bruising[  ];  bleeding[  ];  anemia[  ];  Neuro: TIA[  ];  headaches[  ];  stroke[  ];  vertigo[  ];  seizures[  ];   paresthesias[  ];  difficulty walking[  ];  Psych:depression[  ]; anxiety[  ];  Endocrine: diabetes[  ];  thyroid dysfunction[  ];          [id number]}      Physical Exam: BP 126/81 (BP Location: Left Arm)    Pulse 90    Temp 98.2 F (36.8 C) (Oral)    Resp 14    Ht 6\' 4"  (1.93 m)    Wt 79.5 kg    SpO2 96%    BMI 21.33 kg/m    General appearance: well-appearing, NAD Head: Normocephalic, without obvious abnormality, atraumatic Resp: clear  to auscultation bilaterally Cardio: regular rate and rhythm, S1, S2 normal, no murmur, click, rub or gallop GI: soft, non-tender; bowel sounds normal; no masses,  no organomegaly Extremities: extremities normal, atraumatic, no cyanosis or edema Neurologic: Alert and oriented X 3, normal strength and tone. Normal symmetric reflexes. Normal coordination and gait  Diagnostic Studies & Laboratory data:     Recent Radiology Findings:   CT CHEST WO CONTRAST  Result Date: 07/21/2019 CLINICAL DATA:  Right pneumothorax. Decreased breath sounds on the right. EXAM: CT CHEST WITHOUT CONTRAST TECHNIQUE: Multidetector CT imaging of the chest was performed following the standard protocol without IV contrast. COMPARISON:  Chest x-ray and CT urogram earlier today FINDINGS: Cardiovascular: Ascending thoracic aorta slightly aneurysmal at 4 cm. Extensive coronary artery  calcifications. Scattered aortic calcifications. Heart is normal size. Mediastinum/Nodes: Borderline size mediastinal lymph nodes. Right paratracheal lymph node has a short axis diameter of 9 mm. Other similarly sized and smaller mediastinal lymph nodes. No axillary or hilar adenopathy. Lungs/Pleura: Right side chest tube in place. Moderate-sized residual right pneumothorax. Paraseptal emphysema noted within the upper lobes bilaterally. Small right pleural effusion. Airspace disease in the right middle lobe and right lower lobe could reflect atelectasis or pneumonia. Minimal dependent atelectasis in the left lung. Upper Abdomen: Imaging into the upper abdomen shows no acute findings. Stomach is moderately distended with fluid. Musculoskeletal: Chest wall soft tissues are unremarkable. No acute bony abnormality. IMPRESSION: Right chest tube in place with moderate residual right pneumothorax. Small right pleural effusion. Right middle and lower lobe atelectasis or pneumonia. Paraseptal emphysema. Extensive coronary artery disease. Moderate distention of the stomach with fluid. Aortic Atherosclerosis (ICD10-I70.0) and Emphysema (ICD10-J43.9). Electronically Signed   By: Charlett Nose M.D.   On: 07/21/2019 14:00   DG Chest Port 1 View  Result Date: 07/22/2019 CLINICAL DATA:  Right pneumothorax. Chest tube in place. EXAM: PORTABLE CHEST 1 VIEW COMPARISON:  Chest x-rays dated 07/22/2019 and 07/21/2019 and chest CT dated 07/21/2019 FINDINGS: There is a tiny residual right apical pneumothorax. Chest tube in good position. Moderate right pleural effusion, unchanged. Left lung is clear. Heart size and vascularity are normal. No significant bone abnormality. IMPRESSION: 1. Tiny residual right apical pneumothorax. 2. Stable moderate right pleural effusion. Electronically Signed   By: Francene Boyers M.D.   On: 07/22/2019 12:10   DG CHEST PORT 1 VIEW  Result Date: 07/22/2019 CLINICAL DATA:  Tension pneumothorax, chest tube  EXAM: PORTABLE CHEST 1 VIEW COMPARISON:  07/21/2019 FINDINGS: Right chest tube remains in place, unchanged. Tiny residual right apical pneumothorax. Airspace disease in the right mid and lower lung. Layering right effusion. Left lung clear. Heart is normal size. IMPRESSION: Right chest tube remains in place with tiny residual right apical pneumothorax, stable. Layering right effusion with right lower lobe atelectasis. Electronically Signed   By: Charlett Nose M.D.   On: 07/22/2019 08:58   DG Chest Port 1 View  Result Date: 07/22/2019 CLINICAL DATA:  Chest pain EXAM: PORTABLE CHEST 1 VIEW COMPARISON:  July 21, 2019 FINDINGS: The heart size and mediastinal contours are within normal limits. There is a right-sided pigtail catheter seen. There is a tiny right apical pneumothorax still present. Probable layering small right pleural effusion seen. The left lung is clear. No acute osseous abnormality. IMPRESSION: Residual tiny right apical pneumothorax. Layering right pleural effusion. Electronically Signed   By: Jonna Clark M.D.   On: 07/22/2019 02:21   DG CHEST PORT 1 VIEW  Result Date: 07/21/2019 CLINICAL DATA:  RIGHT-side chest tube EXAM: PORTABLE CHEST 1 VIEW COMPARISON:  CT abdomen and pelvis 07/21/2019 FINDINGS: Interval placement of pigtail RIGHT thoracostomy tube with marked decrease in pneumothorax and resolution of mediastinal shift RIGHT to LEFT. Normal heart size and mediastinal contours. Residual atelectasis RIGHT base. LEFT lung clear. Tiny RIGHT pleural effusion. No acute osseous findings. IMPRESSION: Marked decrease in RIGHT pneumothorax and resolution of RIGHT to LEFT mediastinal shift since thoracostomy tube insertion. Residual atelectasis and minimal pleural effusion at RIGHT lung base. Electronically Signed   By: Ulyses Southward M.D.   On: 07/21/2019 12:37   CT Renal Stone Study  Result Date: 07/21/2019 CLINICAL DATA:  54 year old male with history of flank pain. Suspected kidney stone. EXAM: CT  ABDOMEN AND PELVIS WITHOUT CONTRAST TECHNIQUE: Multidetector CT imaging of the abdomen and pelvis was performed following the standard protocol without IV contrast. COMPARISON:  No priors. FINDINGS: Lower chest: Large right pneumothorax with near complete collapse of the visualized right lung. Trace volume of right-sided pleural fluid lying dependently. There may be some mild mass effect on cardiomediastinal structures which appears slightly shifted to the left. Aortic atherosclerosis. Calcified atherosclerotic plaque also noted in the left circumflex coronary artery. Hepatobiliary: No definite suspicious cystic or solid hepatic lesions are confidently identified on today's noncontrast CT examination. Atrophy of the left lobe of the liver. Calcified granuloma in the central liver. Unenhanced appearance of the gallbladder is unremarkable. Pancreas: No definite pancreatic mass or peripancreatic fluid collections or inflammatory changes are noted on today's noncontrast CT examination. Spleen: Unremarkable. Adrenals/Urinary Tract: No calculi are identified within the collecting system of either kidney, along the course of either ureter, or within the lumen of the urinary bladder. No hydroureteronephrosis. 3.3 x 2.8 x 3.1 cm low-attenuation lesion in the interpolar region of the right kidney, incompletely characterized on today's non-contrast CT examination, but statistically likely to represent a cyst. Unenhanced appearance of the left kidney and bilateral adrenal glands is unremarkable. Unenhanced appearance of the urinary bladder is normal. Stomach/Bowel: Unenhanced appearance of the stomach is normal. No pathologic dilatation of small bowel or colon. Normal appendix. Vascular/Lymphatic: Aortic atherosclerosis. No lymphadenopathy noted in the abdomen or pelvis. Reproductive: Prostate gland and seminal vesicles are unremarkable in appearance. Other: No significant volume of ascites.  No pneumoperitoneum.  Musculoskeletal: There are no aggressive appearing lytic or blastic lesions noted in the visualized portions of the skeleton. IMPRESSION: 1. Large right-sided hydropneumothorax (large pneumothorax with trace volume of fluid) which appears likely under tension. 2. No acute findings are noted in the abdomen or pelvis to account for the patient's symptoms. Specifically, no urinary tract calculi and no findings of urinary tract obstruction are noted at this time. 3. Aortic atherosclerosis, in addition to at least left circumflex coronary artery disease. Please note that although the presence of coronary artery calcium documents the presence of coronary artery disease, the severity of this disease and any potential stenosis cannot be assessed on this non-gated CT examination. Assessment for potential risk factor modification, dietary therapy or pharmacologic therapy may be warranted, if clinically indicated. Critical Value/emergent results were called by telephone at the time of interpretation on 07/21/2019 at 11:33 am to provider [facility], who verbally acknowledged these results. Electronically Signed   By: Trudie Reed M.D.   On: 07/21/2019 11:33     I have independently reviewed the above radiologic studies and discussed with the patient   Recent Lab Findings: Lab Results  Component Value Date   WBC 21.1 (H) 07/22/2019  HGB 14.2 07/22/2019   HCT 41.4 07/22/2019   PLT 193 07/22/2019   GLUCOSE 126 (H) 07/22/2019   ALT 15 07/22/2019   AST 23 07/22/2019   NA 135 07/22/2019   K 3.7 07/22/2019   CL 101 07/22/2019   CREATININE 0.78 07/22/2019   BUN 11 07/22/2019   CO2 23 07/22/2019      Assessment / Plan:    54 yo man with h/o tobacco abuse and bilateral bleb disease now with 1st time right PTX which relieved his acute and life-threatening problem, but the PTX persists. Agree that VATS is most reliable way to resolve PTX and to prevent recurrence.  I have explained the risks and benefits of  the procedure with the patient and wife and they wish to proceed on 07/23/19     I  spent 30 minutes counseling the patient face to face.   Odis Turck Z. Vickey Sages, MD [phone] 07/22/2019 5:50 PM

## 2019-07-22 NOTE — Progress Notes (Addendum)
eLink Physician-Brief Progress Note Patient Name: Caleb Young. DOB: [birth year] MRN: [id number]   Date of Service  07/22/2019  HPI/Events of Note  Findings from CXR:  There is a right-sided pigtail catheter seen. There is a tiny right apical pneumothorax still present. Probable layering small right pleural effusion seen. Frankly, I do not appreciate the "residual R apical pneumothorax". Given Dilaudid by ground team.   eICU Interventions  Will order: 1. Management per ground team.      Intervention Category Major Interventions: Other:  Lenell Antu 07/22/2019, 2:37 AM

## 2019-07-22 NOTE — Progress Notes (Signed)
eLink Physician-Brief Progress Note Patient Name: Caleb Young. DOB: January 28, 1966 MRN: [id number]   Date of Service  07/22/2019  HPI/Events of Note  Patient c/o chest and abdominal pain. Feels like "trapped gas".  eICU Interventions  Will order: 1. Portable CXR STAT. 2. Simethicone 80 mg PO Q 6 hours PRN. 3. Will ask ground team to evaluate him at bedside.      Intervention Category Major Interventions: Other:  [name] Dennard Nip 07/22/2019, 2:04 AM

## 2019-07-23 ENCOUNTER — Encounter (HOSPITAL_COMMUNITY)
Admission: EM | Disposition: A | Discharge status: Home / Self Care | Payer: Self-pay | Source: Home / Self Care | Attending: Cardiothoracic Surgery

## 2019-07-23 ENCOUNTER — Inpatient Hospital Stay (HOSPITAL_COMMUNITY): Payer: No Typology Code available for payment source

## 2019-07-23 ENCOUNTER — Other Ambulatory Visit: Payer: Self-pay

## 2019-07-23 ENCOUNTER — Inpatient Hospital Stay (HOSPITAL_COMMUNITY): Payer: No Typology Code available for payment source | Admitting: Anesthesiology

## 2019-07-23 ENCOUNTER — Encounter (HOSPITAL_COMMUNITY): Payer: Self-pay | Admitting: Internal Medicine

## 2019-07-23 DIAGNOSIS — J9311 Primary spontaneous pneumothorax: Secondary | ICD-10-CM

## 2019-07-23 DIAGNOSIS — J939 Pneumothorax, unspecified: Secondary | ICD-10-CM | POA: Diagnosis present

## 2019-07-23 HISTORY — PX: VIDEO ASSISTED THORACOSCOPY: SHX5073

## 2019-07-23 HISTORY — PX: STAPLING OF BLEBS: SHX6429

## 2019-07-23 LAB — TYPE AND SCREEN
ABO/RH(D): O POS
Antibody Screen: NEGATIVE

## 2019-07-23 LAB — CBC WITH DIFFERENTIAL/PLATELET
Abs Immature Granulocytes: 0.46 10*3/uL — ABNORMAL HIGH (ref 0.00–0.07)
Basophils Absolute: 0.1 10*3/uL (ref 0.0–0.1)
Basophils Relative: 0 %
Eosinophils Absolute: 0.1 10*3/uL (ref 0.0–0.5)
Eosinophils Relative: 1 %
HCT: 41.5 % (ref 39.0–52.0)
Hemoglobin: 14.1 g/dL (ref 13.0–17.0)
Immature Granulocytes: 2 %
Lymphocytes Relative: 5 %
Lymphs Abs: 1 10*3/uL (ref 0.7–4.0)
MCH: 32 pg (ref 26.0–34.0)
MCHC: 34 g/dL (ref 30.0–36.0)
MCV: 94.1 fL (ref 80.0–100.0)
Monocytes Absolute: 1.3 10*3/uL — ABNORMAL HIGH (ref 0.1–1.0)
Monocytes Relative: 7 %
Neutro Abs: 15.9 10*3/uL — ABNORMAL HIGH (ref 1.7–7.7)
Neutrophils Relative %: 85 %
Platelets: 156 10*3/uL (ref 150–400)
RBC: 4.41 MIL/uL (ref 4.22–5.81)
RDW: 11.9 % (ref 11.5–15.5)
WBC: 18.8 10*3/uL — ABNORMAL HIGH (ref 4.0–10.5)
nRBC: 0 % (ref 0.0–0.2)

## 2019-07-23 LAB — BASIC METABOLIC PANEL
Anion gap: 10 (ref 5–15)
BUN: 15 mg/dL (ref 6–20)
CO2: 25 mmol/L (ref 22–32)
Calcium: 8.7 mg/dL — ABNORMAL LOW (ref 8.9–10.3)
Chloride: 99 mmol/L (ref 98–111)
Creatinine, Ser: 0.82 mg/dL (ref 0.61–1.24)
GFR calc Af Amer: >60 mL/min (ref >60)
GFR calc non Af Amer: >60 mL/min (ref >60)
Glucose, Bld: 95 mg/dL (ref 70–99)
Potassium: 3.9 mmol/L (ref 3.5–5.1)
Sodium: 134 mmol/L — ABNORMAL LOW (ref 135–145)

## 2019-07-23 LAB — ABO/RH: ABO/RH(D): O POS

## 2019-07-23 LAB — MRSA PCR SCREENING: MRSA by PCR: NEGATIVE

## 2019-07-23 LAB — PROTIME-INR
INR: 1.1 (ref 0.8–1.2)
Prothrombin Time: 14.5 seconds (ref 11.4–15.2)

## 2019-07-23 SURGERY — VIDEO ASSISTED THORACOSCOPY
Anesthesia: General | Laterality: Right

## 2019-07-23 MED ORDER — OXYCODONE HCL 5 MG PO TABS
5.0000 mg | ORAL_TABLET | ORAL | Status: DC | PRN
  Administered 2019-07-23 – 2019-08-05 (×57): 10 mg via ORAL
  Filled 2019-07-23 (×58): qty 2

## 2019-07-23 MED ORDER — LIDOCAINE 2% (20 MG/ML) 5 ML SYRINGE
INTRAMUSCULAR | Status: DC | PRN
  Administered 2019-07-23: 100 mg via INTRAVENOUS

## 2019-07-23 MED ORDER — LACTATED RINGERS IV SOLN: INTRAVENOUS | Status: DC | PRN

## 2019-07-23 MED ORDER — FENTANYL CITRATE (PF) 250 MCG/5ML IJ SOLN
INTRAMUSCULAR | Status: DC | PRN
  Administered 2019-07-23 (×6): 50 ug via INTRAVENOUS
  Administered 2019-07-23: 150 ug via INTRAVENOUS
  Administered 2019-07-23 (×6): 50 ug via INTRAVENOUS

## 2019-07-23 MED ORDER — BUPIVACAINE LIPOSOME 1.3 % IJ SUSP
20.0000 mL | Freq: Once | INTRAMUSCULAR | Status: DC
  Filled 2019-07-23 (×2): qty 20

## 2019-07-23 MED ORDER — DEXMEDETOMIDINE HCL IN NACL 200 MCG/50ML IV SOLN
INTRAVENOUS | Status: DC | PRN
  Administered 2019-07-23 (×3): 20 ug via INTRAVENOUS

## 2019-07-23 MED ORDER — LIDOCAINE 2% (20 MG/ML) 5 ML SYRINGE
INTRAMUSCULAR | Status: AC
  Filled 2019-07-23: qty 5

## 2019-07-23 MED ORDER — MIDAZOLAM HCL 2 MG/2ML IJ SOLN
INTRAMUSCULAR | Status: AC
  Filled 2019-07-23: qty 2

## 2019-07-23 MED ORDER — SUGAMMADEX SODIUM 200 MG/2ML IV SOLN
INTRAVENOUS | Status: DC | PRN
  Administered 2019-07-23: 200 mg via INTRAVENOUS

## 2019-07-23 MED ORDER — MORPHINE SULFATE (PF) 2 MG/ML IV SOLN
2.0000 mg | Freq: Once | INTRAVENOUS | Status: AC
  Administered 2019-07-23: 2 mg via INTRAVENOUS

## 2019-07-23 MED ORDER — BISACODYL 5 MG PO TBEC
10.0000 mg | DELAYED_RELEASE_TABLET | Freq: Every day | ORAL | Status: DC
  Administered 2019-07-25 – 2019-08-05 (×8): 10 mg via ORAL
  Filled 2019-07-23 (×13): qty 2

## 2019-07-23 MED ORDER — BUPIVACAINE HCL (PF) 0.5 % IJ SOLN
INTRAMUSCULAR | Status: DC | PRN
  Administered 2019-07-23: 30 mL

## 2019-07-23 MED ORDER — ACETAMINOPHEN 160 MG/5ML PO SOLN: 1000.0000 mg | Freq: Four times a day (QID) | ORAL | Status: AC

## 2019-07-23 MED ORDER — ROCURONIUM BROMIDE 10 MG/ML (PF) SYRINGE
PREFILLED_SYRINGE | INTRAVENOUS | Status: DC | PRN
  Administered 2019-07-23: 100 mg via INTRAVENOUS

## 2019-07-23 MED ORDER — IPRATROPIUM-ALBUTEROL 0.5-2.5 (3) MG/3ML IN SOLN
3.0000 mL | Freq: Four times a day (QID) | RESPIRATORY_TRACT | Status: DC
  Administered 2019-07-23 – 2019-07-24 (×5): 3 mL via RESPIRATORY_TRACT
  Filled 2019-07-23 (×6): qty 3

## 2019-07-23 MED ORDER — ACETAMINOPHEN 500 MG PO TABS
1000.0000 mg | ORAL_TABLET | Freq: Four times a day (QID) | ORAL | Status: AC
  Administered 2019-07-23 – 2019-07-28 (×19): 1000 mg via ORAL
  Filled 2019-07-23 (×20): qty 2

## 2019-07-23 MED ORDER — DEXAMETHASONE SODIUM PHOSPHATE 10 MG/ML IJ SOLN
INTRAMUSCULAR | Status: AC
  Filled 2019-07-23: qty 1

## 2019-07-23 MED ORDER — SENNOSIDES-DOCUSATE SODIUM 8.6-50 MG PO TABS
1.0000 | ORAL_TABLET | Freq: Every day | ORAL | Status: DC
  Administered 2019-07-23 – 2019-08-02 (×11): 1 via ORAL
  Filled 2019-07-23 (×13): qty 1

## 2019-07-23 MED ORDER — LABETALOL HCL 5 MG/ML IV SOLN
5.0000 mg | INTRAVENOUS | Status: DC | PRN
  Administered 2019-07-23 (×2): 5 mg via INTRAVENOUS

## 2019-07-23 MED ORDER — PHENYLEPHRINE HCL-NACL 10-0.9 MG/250ML-% IV SOLN
INTRAVENOUS | Status: DC | PRN
  Administered 2019-07-23: 50 ug/min via INTRAVENOUS

## 2019-07-23 MED ORDER — TRAMADOL HCL 50 MG PO TABS
50.0000 mg | ORAL_TABLET | Freq: Four times a day (QID) | ORAL | Status: DC | PRN
  Administered 2019-07-24 – 2019-08-04 (×12): 100 mg via ORAL
  Filled 2019-07-23 (×14): qty 2

## 2019-07-23 MED ORDER — HYDROMORPHONE HCL 1 MG/ML IJ SOLN: 0.2500 mg | INTRAMUSCULAR | Status: DC | PRN

## 2019-07-23 MED ORDER — CEFAZOLIN SODIUM-DEXTROSE 2-4 GM/100ML-% IV SOLN
2.0000 g | INTRAVENOUS | Status: AC
  Administered 2019-07-23: 2 g via INTRAVENOUS
  Filled 2019-07-23 (×2): qty 100

## 2019-07-23 MED ORDER — LABETALOL HCL 5 MG/ML IV SOLN
INTRAVENOUS | Status: AC
  Filled 2019-07-23: qty 4

## 2019-07-23 MED ORDER — DEXAMETHASONE SODIUM PHOSPHATE 10 MG/ML IJ SOLN
INTRAMUSCULAR | Status: DC | PRN
  Administered 2019-07-23: 5 mg via INTRAVENOUS

## 2019-07-23 MED ORDER — TALC 5 G PL SUSR
INTRAPLEURAL | Status: DC | PRN
  Administered 2019-07-23: 4 g via INTRAPLEURAL

## 2019-07-23 MED ORDER — PROPOFOL 10 MG/ML IV BOLUS
INTRAVENOUS | Status: AC
  Filled 2019-07-23: qty 20

## 2019-07-23 MED ORDER — ESMOLOL HCL 100 MG/10ML IV SOLN
INTRAVENOUS | Status: DC | PRN
  Administered 2019-07-23: 30 mg via INTRAVENOUS
  Administered 2019-07-23: 20 mg via INTRAVENOUS

## 2019-07-23 MED ORDER — ONDANSETRON HCL 4 MG/2ML IJ SOLN: 4.0000 mg | Freq: Once | INTRAMUSCULAR | Status: DC | PRN

## 2019-07-23 MED ORDER — ONDANSETRON HCL 4 MG/2ML IJ SOLN
INTRAMUSCULAR | Status: DC | PRN
  Administered 2019-07-23: 4 mg via INTRAVENOUS

## 2019-07-23 MED ORDER — PROPOFOL 10 MG/ML IV BOLUS
INTRAVENOUS | Status: DC | PRN
  Administered 2019-07-23: 50 mg via INTRAVENOUS
  Administered 2019-07-23: 150 mg via INTRAVENOUS

## 2019-07-23 MED ORDER — PHENYLEPHRINE 40 MCG/ML (10ML) SYRINGE FOR IV PUSH (FOR BLOOD PRESSURE SUPPORT)
PREFILLED_SYRINGE | INTRAVENOUS | Status: DC | PRN
  Administered 2019-07-23: 120 ug via INTRAVENOUS

## 2019-07-23 MED ORDER — FENTANYL CITRATE (PF) 250 MCG/5ML IJ SOLN
INTRAMUSCULAR | Status: AC
  Filled 2019-07-23: qty 5

## 2019-07-23 MED ORDER — MEPERIDINE HCL 25 MG/ML IJ SOLN: 6.2500 mg | INTRAMUSCULAR | Status: DC | PRN

## 2019-07-23 MED ORDER — 0.9 % SODIUM CHLORIDE (POUR BTL) OPTIME
TOPICAL | Status: DC | PRN
  Administered 2019-07-23: 1000 mL

## 2019-07-23 MED ORDER — ONDANSETRON HCL 4 MG/2ML IJ SOLN: 4.0000 mg | Freq: Four times a day (QID) | INTRAMUSCULAR | Status: DC | PRN

## 2019-07-23 MED ORDER — MIDAZOLAM HCL 2 MG/2ML IJ SOLN
INTRAMUSCULAR | Status: DC | PRN
  Administered 2019-07-23: 2 mg via INTRAVENOUS

## 2019-07-23 MED ORDER — ONDANSETRON HCL 4 MG/2ML IJ SOLN
INTRAMUSCULAR | Status: AC
  Filled 2019-07-23: qty 2

## 2019-07-23 MED ORDER — GLYCOPYRROLATE PF 0.2 MG/ML IJ SOSY
PREFILLED_SYRINGE | INTRAMUSCULAR | Status: AC
  Filled 2019-07-23: qty 1

## 2019-07-23 MED ORDER — PHENYLEPHRINE 40 MCG/ML (10ML) SYRINGE FOR IV PUSH (FOR BLOOD PRESSURE SUPPORT)
PREFILLED_SYRINGE | INTRAVENOUS | Status: AC
  Filled 2019-07-23: qty 10

## 2019-07-23 MED ORDER — BUPIVACAINE HCL (PF) 0.5 % IJ SOLN
INTRAMUSCULAR | Status: AC
  Filled 2019-07-23: qty 30

## 2019-07-23 MED ORDER — TALC (STERITALC) POWDER FOR INTRAPLEURAL USE
INTRAPLEURAL | Status: AC
  Filled 2019-07-23: qty 4

## 2019-07-23 MED ORDER — BUPIVACAINE LIPOSOME 1.3 % IJ SUSP
INTRAMUSCULAR | Status: DC | PRN
  Administered 2019-07-23: 20 mL

## 2019-07-23 MED ORDER — SODIUM CHLORIDE 0.9 % IV SOLN: INTRAVENOUS | Status: DC | PRN

## 2019-07-23 MED ORDER — FENTANYL CITRATE (PF) 100 MCG/2ML IJ SOLN: 25.0000 ug | INTRAMUSCULAR | Status: DC | PRN

## 2019-07-23 MED ORDER — ROCURONIUM BROMIDE 10 MG/ML (PF) SYRINGE
PREFILLED_SYRINGE | INTRAVENOUS | Status: AC
  Filled 2019-07-23: qty 10

## 2019-07-23 SURGICAL SUPPLY — 112 items
ADAPTER VALVE BIOPSY EBUS (MISCELLANEOUS) IMPLANT
ADPTR VALVE BIOPSY EBUS (MISCELLANEOUS)
BLADE CLIPPER SURG (BLADE) ×3 IMPLANT
BRUSH CYTOL CELLEBRITY 1.5X140 (MISCELLANEOUS) IMPLANT
CANISTER SUCT 3000ML PPV (MISCELLANEOUS) ×6 IMPLANT
CATH ROBINSON RED A/P 18FR (CATHETERS) ×2 IMPLANT
CATH THORACIC 28FR (CATHETERS) ×2 IMPLANT
CATH THORACIC 28FR RT ANG (CATHETERS) ×2 IMPLANT
CLIP VESOCCLUDE MED 6/CT (CLIP) ×3 IMPLANT
CNTNR URN SCR LID CUP LEK RST (MISCELLANEOUS) ×4 IMPLANT
CONN ST 1/4X3/8  BEN (MISCELLANEOUS)
CONN ST 1/4X3/8 BEN (MISCELLANEOUS) IMPLANT
CONN Y 3/8X3/8X3/8  BEN (MISCELLANEOUS)
CONN Y 3/8X3/8X3/8 BEN (MISCELLANEOUS) IMPLANT
CONT SPEC 4OZ STRL OR WHT (MISCELLANEOUS) ×2
COVER BACK TABLE 60X90IN (DRAPES) ×3 IMPLANT
COVER SURGICAL LIGHT HANDLE (MISCELLANEOUS) ×1 IMPLANT
DERMABOND ADVANCED (GAUZE/BANDAGES/DRESSINGS) ×1
DERMABOND ADVANCED .7 DNX12 (GAUZE/BANDAGES/DRESSINGS) ×2 IMPLANT
DRAIN CHANNEL 28F RND 3/8 FF (WOUND CARE) ×3 IMPLANT
DRAPE CV SPLIT W-CLR ANES SCRN (DRAPES) ×3 IMPLANT
DRAPE ORTHO SPLIT 77X108 STRL (DRAPES) ×1
DRAPE SURG ORHT 6 SPLT 77X108 (DRAPES) ×2 IMPLANT
DRAPE WARM FLUID 44X44 (DRAPES) ×1 IMPLANT
ELECT BLADE 4.0 EZ CLEAN MEGAD (MISCELLANEOUS) ×3
ELECT BLADE 6.5 EXT (BLADE) ×5 IMPLANT
ELECT REM PT RETURN 9FT ADLT (ELECTROSURGICAL) ×3
ELECTRODE BLDE 4.0 EZ CLN MEGD (MISCELLANEOUS) ×1 IMPLANT
ELECTRODE REM PT RTRN 9FT ADLT (ELECTROSURGICAL) ×2 IMPLANT
FILTER SMOKE EVAC ULPA (FILTER) ×3 IMPLANT
FORCEPS BIOP RJ4 1.8 (CUTTING FORCEPS) IMPLANT
GAUZE SPONGE 4X4 12PLY STRL (GAUZE/BANDAGES/DRESSINGS) ×3 IMPLANT
GLOVE BIO SURGEON STRL SZ 6.5 (GLOVE) ×2 IMPLANT
GLOVE BIOGEL PI IND STRL 6.5 (GLOVE) ×2 IMPLANT
GLOVE BIOGEL PI INDICATOR 6.5 (GLOVE) ×2
GLOVE NEODERM STRL 7.5 LF PF (GLOVE) ×4 IMPLANT
GLOVE SURG NEODERM 7.5  LF PF (GLOVE) ×2
GOWN STRL REUS W/ TWL LRG LVL3 (GOWN DISPOSABLE) ×4 IMPLANT
GOWN STRL REUS W/TWL LRG LVL3 (GOWN DISPOSABLE) ×2
HANDLE STAPLE ENDO GIA SHORT (STAPLE) ×1
HEMOSTAT SURGICEL 2X14 (HEMOSTASIS) IMPLANT
KIT BASIN OR (CUSTOM PROCEDURE TRAY) ×3 IMPLANT
KIT CLEAN ENDO COMPLIANCE (KITS) ×1 IMPLANT
KIT SUCTION CATH 14FR (SUCTIONS) ×3 IMPLANT
KIT TURNOVER KIT B (KITS) ×3 IMPLANT
MARKER SKIN DUAL TIP RULER LAB (MISCELLANEOUS) IMPLANT
NDL HYPO 25GX1X1/2 BEV (NEEDLE) IMPLANT
NEEDLE HYPO 25GX1X1/2 BEV (NEEDLE) ×3 IMPLANT
NS IRRIG 1000ML POUR BTL (IV SOLUTION) ×9 IMPLANT
OIL SILICONE PENTAX (PARTS (SERVICE/REPAIRS)) ×3 IMPLANT
PACK CHEST (CUSTOM PROCEDURE TRAY) ×3 IMPLANT
PAD ARMBOARD 7.5X6 YLW CONV (MISCELLANEOUS) ×4 IMPLANT
PENCIL SMOKE EVACUATOR (MISCELLANEOUS) ×3 IMPLANT
POUCH ENDO CATCH II 15MM (MISCELLANEOUS) IMPLANT
POUCH SPECIMEN RETRIEVAL 10MM (ENDOMECHANICALS) IMPLANT
RELOAD TRI 60 ART MED THCK PUR (STAPLE) ×10 IMPLANT
SCISSORS LAP 5X35 DISP (ENDOMECHANICALS) IMPLANT
SEALANT PROGEL (MISCELLANEOUS) IMPLANT
SEALANT SURG COSEAL 4ML (VASCULAR PRODUCTS) IMPLANT
SEALANT SURG COSEAL 8ML (VASCULAR PRODUCTS) IMPLANT
SHEARS HARMONIC HDI 20CM (ELECTROSURGICAL) IMPLANT
SLEEVE SUCTION 125 (MISCELLANEOUS) ×5 IMPLANT
SOL ANTI FOG 6CC (MISCELLANEOUS) ×2 IMPLANT
SOLUTION ANTI FOG 6CC (MISCELLANEOUS) ×1
SPECIMEN JAR MEDIUM (MISCELLANEOUS) ×3 IMPLANT
SPONGE INTESTINAL PEANUT (DISPOSABLE) ×1 IMPLANT
SPONGE TONSIL TAPE 1 RFD (DISPOSABLE) ×3 IMPLANT
STAPLER ENDO GIA 12 SHRT THIN (STAPLE) IMPLANT
STAPLER ENDO GIA 12MM SHORT (STAPLE) ×2 IMPLANT
SUT MNCRL AB 4-0 PS2 18 (SUTURE) ×3 IMPLANT
SUT PDS AB 1 CTX 36 (SUTURE) ×1 IMPLANT
SUT PROLENE 4 0 RB 1 (SUTURE)
SUT PROLENE 4-0 RB1 .5 CRCL 36 (SUTURE) IMPLANT
SUT SILK  1 MH (SUTURE) ×2
SUT SILK 1 MH (SUTURE) ×4 IMPLANT
SUT SILK 1 TIES 10X30 (SUTURE) ×1 IMPLANT
SUT SILK 2 0 SH (SUTURE) IMPLANT
SUT SILK 2 0SH CR/8 30 (SUTURE) IMPLANT
SUT SILK 3 0 SH 30 (SUTURE) IMPLANT
SUT SILK 3 0SH CR/8 30 (SUTURE) ×3 IMPLANT
SUT VIC AB 0 CT1 27 (SUTURE) ×1
SUT VIC AB 0 CT1 27XBRD ANBCTR (SUTURE) ×2 IMPLANT
SUT VIC AB 0 CTX 27 (SUTURE) IMPLANT
SUT VIC AB 1 CTX 27 (SUTURE) ×3 IMPLANT
SUT VIC AB 2-0 CT1 27 (SUTURE)
SUT VIC AB 2-0 CT1 TAPERPNT 27 (SUTURE) IMPLANT
SUT VIC AB 2-0 CTX 36 (SUTURE) ×2 IMPLANT
SUT VIC AB 3-0 MH 27 (SUTURE) IMPLANT
SUT VIC AB 3-0 SH 27 (SUTURE)
SUT VIC AB 3-0 SH 27X BRD (SUTURE) IMPLANT
SUT VIC AB 3-0 X1 27 (SUTURE) ×3 IMPLANT
SUT VICRYL 0 UR6 27IN ABS (SUTURE) IMPLANT
SUT VICRYL 2 TP 1 (SUTURE) IMPLANT
SYR 20ML ECCENTRIC (SYRINGE) ×3 IMPLANT
SYR 30ML LL (SYRINGE) ×3 IMPLANT
SYR BULB IRRIGATION 50ML (SYRINGE) ×2 IMPLANT
SYSTEM SAHARA CHEST DRAIN ATS (WOUND CARE) ×3 IMPLANT
SYSTEM SAHARA CHEST DRAIN RE-I (WOUND CARE) ×2 IMPLANT
TAPE CLOTH SURG 4X10 WHT LF (GAUZE/BANDAGES/DRESSINGS) ×2 IMPLANT
TIP APPLICATOR SPRAY EXTEND 16 (VASCULAR PRODUCTS) IMPLANT
TOWEL GREEN STERILE (TOWEL DISPOSABLE) ×3 IMPLANT
TOWEL GREEN STERILE FF (TOWEL DISPOSABLE) ×3 IMPLANT
TRAP FLUID SMOKE EVACUATOR (MISCELLANEOUS) ×3 IMPLANT
TRAP SPECIMEN MUCOUS 40CC (MISCELLANEOUS) IMPLANT
TRAY FOLEY MTR SLVR 16FR STAT (SET/KITS/TRAYS/PACK) ×3 IMPLANT
TROCAR XCEL BLADELESS 5X75MML (TROCAR) ×3 IMPLANT
TROCAR XCEL NON-BLD 5MMX100MML (ENDOMECHANICALS) IMPLANT
TUBE CONNECTING 20X1/4 (TUBING) ×1 IMPLANT
VALVE BIOPSY  SINGLE USE (MISCELLANEOUS)
VALVE BIOPSY SINGLE USE (MISCELLANEOUS) ×1 IMPLANT
VALVE SUCTION BRONCHIO DISP (MISCELLANEOUS) ×1 IMPLANT
WATER STERILE IRR 1000ML POUR (IV SOLUTION) ×6 IMPLANT

## 2019-07-23 NOTE — H&P (Signed)
History and Physical Interval Note:  07/23/2019 3:11 PM  Caleb Young.  has presented today for surgery, with the diagnosis of PTX BLEBS.  The various methods of treatment have been discussed with the patient and family. After consideration of risks, benefits and other options for treatment, the patient has consented to  Procedure(s): VIDEO BRONCHOSCOPY (N/A) VIDEO ASSISTED THORACOSCOPY (Right) STAPLING OF BLEBS (Right) as a surgical intervention.  The patient's history has been reviewed, patient examined, no change in status, stable for surgery.  I have reviewed the patient's chart and labs.  Questions were answered to the patient's satisfaction.     [name]

## 2019-07-23 NOTE — Transfer of Care (Signed)
Immediate Anesthesia Transfer of Care Note  Patient: Caleb Young.  Procedure(s) Performed: VIDEO ASSISTED THORACOSCOPY WITH TALC PLEURODESIS (Right ) STAPLING OF BLEBS (Right )  Patient Location: PACU  Anesthesia Type:General  Level of Consciousness: awake, alert  and patient cooperative  Airway & Oxygen Therapy: Patient Spontanous Breathing and Patient connected to nasal cannula oxygen  Post-op Assessment: Report given to RN and Post -op Vital signs reviewed and stable  Post vital signs: Reviewed and stable  Last Vitals:  Vitals Value Taken Time  BP    Temp    Pulse    Resp    SpO2      Last Pain:  Vitals:   07/23/19 1120  TempSrc:   PainSc: 9       Patients Stated Pain Goal: 5 (07/23/19 0841)  Complications: No apparent anesthesia complications

## 2019-07-23 NOTE — Anesthesia Procedure Notes (Signed)
Arterial Line Insertion Start/End3/09/2019 3:05 PM Performed by: Drema Pry, CRNA, CRNA  Preanesthetic checklist: patient identified, IV checked, risks and benefits discussed, surgical consent, monitors and equipment checked and pre-op evaluation Lidocaine 1% used for infiltration Left, radial was placed Catheter size: 20 G Hand hygiene performed  and maximum sterile barriers used  Allen's test indicative of satisfactory collateral circulation Attempts: 1 Procedure performed without using ultrasound guided technique. Following insertion, dressing applied and Biopatch. Post procedure assessment: normal  Patient tolerated the procedure well with no immediate complications.

## 2019-07-23 NOTE — Anesthesia Procedure Notes (Signed)
Procedure Name: Intubation Date/Time: 07/23/2019 3:31 PM Performed by: Janace Litten, CRNA Pre-anesthesia Checklist: Patient identified, Emergency Drugs available, Suction available and Patient being monitored Patient Re-evaluated:Patient Re-evaluated prior to induction Oxygen Delivery Method: Circle System Utilized Preoxygenation: Pre-oxygenation with 100% oxygen Induction Type: IV induction Ventilation: Mask ventilation without difficulty Laryngoscope Size: Mac and 4 Grade View: Grade I Tube type: Oral Endobronchial tube: Double lumen EBT, Left, EBT position confirmed by auscultation and EBT position confirmed by fiberoptic bronchoscope and 39 Fr Number of attempts: 1 Airway Equipment and Method: Stylet and Oral airway Placement Confirmation: ETT inserted through vocal cords under direct vision,  positive ETCO2 and breath sounds checked- equal and bilateral Tube secured with: Tape Dental Injury: Teeth and Oropharynx as per pre-operative assessment

## 2019-07-23 NOTE — Progress Notes (Signed)
Flushed 2w03 chest tube 61ml NS at 1045am and patient tolerated it well. Memory Argue RN was witness.

## 2019-07-23 NOTE — Progress Notes (Signed)
   NAME:  Caleb Cull., MRN:  [id number], DOB:  1965-08-13, LOS: 2 ADMISSION DATE:  07/21/2019, CONSULTATION DATE:  Admitted 3/4 REFERRING MD:  ED, CHIEF COMPLAINT:  Spontaneous ptx  Brief History   Is a 54 year old gentleman who follows at the [place] who presented with spontaneous tension pneumothorax   He was seen in the ED by her team and a right-sided pigtail chest tube drainage system was placed with resolution of his impending tension physiology.  Consults:    Procedures:  3/3 right-sided pigtail chest tube placed  Significant Diagnostic Tests:    Micro Data:    Antimicrobials:   Interim history/subjective:  C/o pain at chest tube site No dyspnea   Objective   Blood pressure (!) 147/80, pulse 97, temperature 98.1 F (36.7 C), temperature source Oral, resp. rate (!) 29, height 6\' 4"  (1.93 m), weight 79.5 kg, SpO2 97 %.        Intake/Output Summary (Last 24 hours) at 07/23/2019 1423 Last data filed at 07/23/2019 1045 Gross per 24 hour  Intake 30 ml  Output 895 ml  Net -865 ml   Filed Weights   07/21/19 1219 07/21/19 1830  Weight: 79.8 kg 79.5 kg    Examination: General: Acutely ill-appearing HENT: mmm poor dentition Lungs: diminished bilaterally no wheezes or crackles, able to complete full sentences.  No air leak on right chest tube Cardiovascular: RRR no mrg Abdomen: scaphoid, soft Extremities: thin, no edema Neuro: normal speech, moves all 4 extremities  Chest xray 3/4 personally reviewed- trace apical right sided ptx noted.   Assessment & Plan:  Spontaneous tension pneumothorax Suspected COPD  Plan: Chest tube placed to waterseal   Plan is for VATS, blebectomy Additional 2 mg morphine given for pain, can use IV Tylenol or NSAIDs which would probably work better  09/20/19 MD. Cyril Mourning. [facility]  If no response to pager , please call [phone]   07/23/2019

## 2019-07-23 NOTE — Progress Notes (Signed)
Dr. Vassie Loll in to see patient.  Discussed pain level and need for a one time break through pain medication.  Verbal order for 2 mg Morphine received and given. Also discussed preop need for abg on room air, and PTT, and I was told that he would not need these.  Patient has had clipping to chest, neck, back, and underarm. Patient and I discussed smoking cessation and while he admits that he has cut down quite a bit, he states that this experience in the hospital will "expedite" his decision to quit.

## 2019-07-23 NOTE — Brief Op Note (Signed)
07/23/2019  5:21 PM  PATIENT:  Caleb Young.  54 y.o. male  PRE-OPERATIVE DIAGNOSIS:  PTX BLEBS  POST-OPERATIVE DIAGNOSIS:  PTX BLEBS  PROCEDURE:  Procedure(s): VIDEO ASSISTED THORACOSCOPY WITH TALC PLEURODESIS (Right) STAPLING OF BLEBS (Right)  SURGEON:  Surgeon(s) and Role:    * [name], MD - Primary  PHYSICIAN ASSISTANT: n/a  ASSISTANTS: staff   ANESTHESIA:   general  EBL:  150 mL   BLOOD ADMINISTERED:none  DRAINS: 2 Chest Tube(s) in the right pleural space   LOCAL MEDICATIONS USED:  OTHER exparel/marcaine mixture  SPECIMEN:  Source of Specimen:  right upper lobe apical wedge  DISPOSITION OF SPECIMEN:  PATHOLOGY  COUNTS:  YES  TOURNIQUET:  * No tourniquets in log *  DICTATION: .Note written in EPIC  PLAN OF CARE: Admit to inpatient   PATIENT DISPOSITION:  PACU - hemodynamically stable.   Delay start of Pharmacological VTE agent (>24hrs) due to surgical blood loss or risk of bleeding: no

## 2019-07-23 NOTE — Anesthesia Preprocedure Evaluation (Signed)
Anesthesia Evaluation  Patient identified by MRN, date of birth, ID band Patient awake    Reviewed: Allergy & Precautions, NPO status , Patient's Chart, lab work & pertinent test results  Airway Mallampati: I  TM Distance: >3 FB Neck ROM: Full    Dental   Pulmonary Patient abstained from smoking.,    Pulmonary exam normal        Cardiovascular Normal cardiovascular exam     Neuro/Psych    GI/Hepatic   Endo/Other    Renal/GU      Musculoskeletal   Abdominal   Peds  Hematology   Anesthesia Other Findings   Reproductive/Obstetrics                             Anesthesia Physical Anesthesia Plan  ASA: III  Anesthesia Plan: General   Post-op Pain Management:    Induction: Intravenous  PONV Risk Score and Plan: 2 and Ondansetron and Midazolam  Airway Management Planned: Double Lumen EBT  Additional Equipment: Arterial line  Intra-op Plan:   Post-operative Plan: Extubation in OR  Informed Consent: I have reviewed the patients History and Physical, chart, labs and discussed the procedure including the risks, benefits and alternatives for the proposed anesthesia with the patient or authorized representative who has indicated his/her understanding and acceptance.       Plan Discussed with: CRNA and Surgeon  Anesthesia Plan Comments:         Anesthesia Quick Evaluation

## 2019-07-24 ENCOUNTER — Inpatient Hospital Stay (HOSPITAL_COMMUNITY): Payer: No Typology Code available for payment source

## 2019-07-24 DIAGNOSIS — Z09 Encounter for follow-up examination after completed treatment for conditions other than malignant neoplasm: Secondary | ICD-10-CM

## 2019-07-24 LAB — BLOOD GAS, ARTERIAL
Acid-Base Excess: 3 mmol/L — ABNORMAL HIGH (ref 0.0–2.0)
Bicarbonate: 26.7 mmol/L (ref 20.0–28.0)
FIO2: 21
O2 Saturation: 95 %
Patient temperature: 37
pCO2 arterial: 38.7 mmHg (ref 32.0–48.0)
pH, Arterial: 7.453 — ABNORMAL HIGH (ref 7.350–7.450)
pO2, Arterial: 73.9 mmHg — ABNORMAL LOW (ref 83.0–108.0)

## 2019-07-24 LAB — CBC
HCT: 38.9 % — ABNORMAL LOW (ref 39.0–52.0)
Hemoglobin: 13.3 g/dL (ref 13.0–17.0)
MCH: 32.2 pg (ref 26.0–34.0)
MCHC: 34.2 g/dL (ref 30.0–36.0)
MCV: 94.2 fL (ref 80.0–100.0)
Platelets: 173 10*3/uL (ref 150–400)
RBC: 4.13 MIL/uL — ABNORMAL LOW (ref 4.22–5.81)
RDW: 11.9 % (ref 11.5–15.5)
WBC: 17.6 10*3/uL — ABNORMAL HIGH (ref 4.0–10.5)
nRBC: 0 % (ref 0.0–0.2)

## 2019-07-24 LAB — BASIC METABOLIC PANEL
Anion gap: 10 (ref 5–15)
BUN: 12 mg/dL (ref 6–20)
CO2: 25 mmol/L (ref 22–32)
Calcium: 8.3 mg/dL — ABNORMAL LOW (ref 8.9–10.3)
Chloride: 99 mmol/L (ref 98–111)
Creatinine, Ser: 0.85 mg/dL (ref 0.61–1.24)
GFR calc Af Amer: >60 mL/min (ref >60)
GFR calc non Af Amer: >60 mL/min (ref >60)
Glucose, Bld: 184 mg/dL — ABNORMAL HIGH (ref 70–99)
Potassium: 3.7 mmol/L (ref 3.5–5.1)
Sodium: 134 mmol/L — ABNORMAL LOW (ref 135–145)

## 2019-07-24 MED ORDER — ENOXAPARIN SODIUM 40 MG/0.4ML ~~LOC~~ SOLN
40.0000 mg | SUBCUTANEOUS | Status: DC
  Administered 2019-07-24 – 2019-07-27 (×4): 40 mg via SUBCUTANEOUS
  Filled 2019-07-24 (×4): qty 0.4

## 2019-07-24 MED ORDER — INFLUENZA VAC SPLIT QUAD 0.5 ML IM SUSY
0.5000 mL | PREFILLED_SYRINGE | INTRAMUSCULAR | Status: AC
  Administered 2019-07-29: 0.5 mL via INTRAMUSCULAR
  Filled 2019-07-24: qty 0.5

## 2019-07-24 MED ORDER — LOSARTAN POTASSIUM 25 MG PO TABS
12.5000 mg | ORAL_TABLET | Freq: Every day | ORAL | Status: DC
  Administered 2019-07-24 – 2019-08-05 (×13): 12.5 mg via ORAL
  Filled 2019-07-24 (×13): qty 1

## 2019-07-24 MED ORDER — IPRATROPIUM-ALBUTEROL 0.5-2.5 (3) MG/3ML IN SOLN
3.0000 mL | Freq: Three times a day (TID) | RESPIRATORY_TRACT | Status: DC
  Administered 2019-07-25 – 2019-07-26 (×4): 3 mL via RESPIRATORY_TRACT
  Filled 2019-07-24 (×4): qty 3

## 2019-07-24 MED ORDER — BACLOFEN 10 MG PO TABS
5.0000 mg | ORAL_TABLET | Freq: Every day | ORAL | Status: DC
  Administered 2019-07-24 – 2019-08-04 (×12): 5 mg via ORAL
  Filled 2019-07-24 (×12): qty 1

## 2019-07-24 MED ORDER — TIZANIDINE HCL 4 MG PO TABS
4.0000 mg | ORAL_TABLET | Freq: Every day | ORAL | Status: DC
  Administered 2019-07-24 – 2019-08-04 (×12): 4 mg via ORAL
  Filled 2019-07-24 (×12): qty 1

## 2019-07-24 NOTE — Plan of Care (Signed)

## 2019-07-24 NOTE — Op Note (Signed)
Procedure(s): VIDEO ASSISTED THORACOSCOPY WITH TALC PLEURODESIS STAPLING OF BLEBS Procedure Note  Caleb Young. male 54 y.o. 07/24/2019  Procedure(s) and Anesthesia Type:    * VIDEO ASSISTED THORACOSCOPY WITH TALC PLEURODESIS - General    * STAPLING OF BLEBS - General  Surgeon(s) and Role:    * [name], MD - Primary   Indications: The patient was admitted to the hospital with a brief history of right-sided chest pain and back pain; he was found to have a right tension PtX, relieved by CT insertion. However, he has a persistent CXR and CT scan worrisome for failure to respond to conservative management.The patient now presents for right VATS bleb resection and pleurodesis after discussing therapeutic alternatives.        Surgeon: [name]   Assistants: staff  Anesthesia: General endotracheal - Double lumen tube  ASA Class: 4    Procedure Detail  VIDEO ASSISTED THORACOSCOPY WITH TALC PLEURODESIS, STAPLING OF BLEBS After informed consent, the patient was taken to the operating room at [facility] on 07/23/2019.  He was placed in the supine position on the operating table.  Anesthesia was begun in a smooth fashion by the general endotracheal technique utilizing a double-lumen tube.  He was then placed in the right side up lateral decubitus position.  The right chest was cleansed and draped with Betadine solution.  A preoperative pause was performed.  An incision was made in the fifth intercostal space in the midclavicular line on the right.  The chest was entered safely.  A counterincision was made in the seventh intercostal space in the anterior axillary line.  This was used as a camera port.  Thoracoscopic inspection of the right chest was performed.  There was a moderate amount of old blood in the sulcus.  There was extensive debris in the lining of the pleural space.  Bleb disease could be seen in the apex of the right upper lobe.  The right upper lobe apex  was stapled and it was notable that some of the lung tissue itself was necrotic.  The stapling was done with reinforced staple lines.  Next extensive mechanical pleurodesis was performed.  Irrigation was then undertaken.  Finally, talc was inserted into the chest for chemical pleurodesis.  2 chest tubes were then inserted in the chest and positioned under thoracoscopic visualization.  The right lung was then reinflated.  Should be noted that multilevel rib block was undertaken with Exparel/Marcaine solution.  The wounds were then closed in layers.  Sterile dressings were applied.  All sponge instrument and needle counts were correct and I was present for all aspects. Findings: As above Estimated Blood Loss:  less than 100 mL         Drains: 2 chest tubes as noted         Total IV Fluids: Per anesthesia  Blood Given: none          Specimens: Right upper lobe wedge resection         Implants: none        Complications:  * No complications entered in OR log *         Disposition: PACU - hemodynamically stable.         Condition: stable
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
## 2019-07-24 NOTE — Progress Notes (Signed)
      [address]       Caleb Young [address]             [phone]      1 Day Post-Op Procedure(s) (LRB): VIDEO ASSISTED THORACOSCOPY WITH TALC PLEURODESIS (Right) STAPLING OF BLEBS (Right)   Subjective:  Patient doing alright.  Is having pain with coughing and movement.  Pain medication is providing relief.  No N/V.Marland Kitchen. tolerating diet  Objective: Vital signs in last 24 hours: Temp:  [97.6 F (36.4 C)-99 F (37.2 C)] 97.7 F (36.5 C) (03/06 0759) Pulse Rate:  [77-131] 99 (03/06 0759) Cardiac Rhythm: Normal sinus rhythm (03/06 0700) Resp:  [12-29] 20 (03/06 0759) BP: (110-147)/(76-98) 146/88 (03/06 0759) SpO2:  [93 %-100 %] 98 % (03/06 0759) Arterial Line BP: (143-156)/(64-79) 154/76 (03/05 1845) Weight:  [79.5 kg] 79.5 kg (03/05 1501)  Intake/Output from previous day: 03/05 0701 - 03/06 0700 In: 2610 [P.O.:480; I.V.:2000; IV Piggyback:100] Out: 2465 [Urine:1875; Blood:150; Chest Tube:440]  General appearance: alert, cooperative and no distress Heart: regular rate and rhythm Lungs: clear to auscultation bilaterally Abdomen: soft, non-tender; bowel sounds normal; no masses,  no organomegaly Wound: clean and dry  Lab Results: Recent Labs    07/23/19 0415 07/24/19 0249  WBC 18.8* 17.6*  HGB 14.1 13.3  HCT 41.5 38.9*  PLT 156 173   BMET:  Recent Labs    07/23/19 0415 07/24/19 0249  NA 134* 134*  K 3.9 3.7  CL 99 99  CO2 25 25  GLUCOSE 95 184*  BUN 15 12  CREATININE 0.82 0.85  CALCIUM 8.7* 8.3*    PT/INR:  Recent Labs    07/23/19 0415  LABPROT 14.5  INR 1.1   ABG    Component Value Date/Time   PHART 7.453 (H) 07/24/2019 0515   HCO3 26.7 07/24/2019 0515   TCO2 29 07/21/2019 1102   O2SAT 95.0 07/24/2019 0515   CBG (last 3)  No results for input(s): GLUCAP in the last 72 hours.  Assessment/Plan: S/P Procedure(s) (LRB): VIDEO ASSISTED THORACOSCOPY WITH TALC PLEURODESIS (Right) STAPLING OF BLEBS (Right)  1.  Chest tube- no  air leak present, minimal output- CXR with apical pneumothorax- leave chest tube on suction today 2. CV-NSR, mild HTN- will restart home Cozaar 3. Lovenox for DVT prophylaxis 4. Pain control- pretty good relief with current regimen, will restart home bedtime pain medications for back issues 5. dispo- patient stable, restart home cozaar for mild HTN, leave chest tube to suction today, repeat CXR in AM   LOS: 3 days    Lowella Dandy, PA-C  07/24/2019

## 2019-07-24 NOTE — Anesthesia Postprocedure Evaluation (Signed)
Anesthesia Post Note  Patient: Caleb Young.  Procedure(s) Performed: VIDEO ASSISTED THORACOSCOPY WITH TALC PLEURODESIS (Right ) STAPLING OF BLEBS (Right )     Patient location during evaluation: PACU Anesthesia Type: General Level of consciousness: awake and alert, patient cooperative and oriented Pain management: pain level controlled Vital Signs Assessment: post-procedure vital signs reviewed and stable Respiratory status: spontaneous breathing, nonlabored ventilation, respiratory function stable and patient connected to nasal cannula oxygen Cardiovascular status: blood pressure returned to baseline and stable Postop Assessment: no apparent nausea or vomiting Anesthetic complications: no    Last Vitals:  Vitals:   07/24/19 0759 07/24/19 1115  BP: (!) 146/88 (!) 152/96  Pulse: 99 98  Resp: 20 19  Temp: 36.5 C (!) 36.3 C  SpO2: 98% 98%    Last Pain:  Vitals:   07/24/19 1115  TempSrc: Oral  PainSc:                  Courtany Mcmurphy,E. Pat Sires

## 2019-07-25 ENCOUNTER — Inpatient Hospital Stay (HOSPITAL_COMMUNITY): Payer: No Typology Code available for payment source

## 2019-07-25 LAB — COMPREHENSIVE METABOLIC PANEL
ALT: 11 U/L (ref 0–44)
AST: 15 U/L (ref 15–41)
Albumin: 2.3 g/dL — ABNORMAL LOW (ref 3.5–5.0)
Alkaline Phosphatase: 69 U/L (ref 38–126)
Anion gap: 9 (ref 5–15)
BUN: 8 mg/dL (ref 6–20)
CO2: 28 mmol/L (ref 22–32)
Calcium: 8.3 mg/dL — ABNORMAL LOW (ref 8.9–10.3)
Chloride: 97 mmol/L — ABNORMAL LOW (ref 98–111)
Creatinine, Ser: 0.82 mg/dL (ref 0.61–1.24)
GFR calc Af Amer: >60 mL/min (ref >60)
GFR calc non Af Amer: >60 mL/min (ref >60)
Glucose, Bld: 111 mg/dL — ABNORMAL HIGH (ref 70–99)
Potassium: 3.9 mmol/L (ref 3.5–5.1)
Sodium: 134 mmol/L — ABNORMAL LOW (ref 135–145)
Total Bilirubin: 0.5 mg/dL (ref 0.3–1.2)
Total Protein: 5.5 g/dL — ABNORMAL LOW (ref 6.5–8.1)

## 2019-07-25 LAB — CBC
HCT: 37.4 % — ABNORMAL LOW (ref 39.0–52.0)
Hemoglobin: 12.9 g/dL — ABNORMAL LOW (ref 13.0–17.0)
MCH: 32.1 pg (ref 26.0–34.0)
MCHC: 34.5 g/dL (ref 30.0–36.0)
MCV: 93 fL (ref 80.0–100.0)
Platelets: 196 10*3/uL (ref 150–400)
RBC: 4.02 MIL/uL — ABNORMAL LOW (ref 4.22–5.81)
RDW: 12 % (ref 11.5–15.5)
WBC: 22.2 10*3/uL — ABNORMAL HIGH (ref 4.0–10.5)
nRBC: 0 % (ref 0.0–0.2)

## 2019-07-25 NOTE — Progress Notes (Addendum)
      [address]       [place] [address]             [phone]      2 Days Post-Op Procedure(s) (LRB): VIDEO ASSISTED THORACOSCOPY WITH TALC PLEURODESIS (Right) STAPLING OF BLEBS (Right)   Subjective:  No new complaints.  Doing well  Objective: Vital signs in last 24 hours: Temp:  [97.4 F (36.3 C)-98.6 F (37 C)] 98.6 F (37 C) (03/07 0819) Pulse Rate:  [98-125] 100 (03/07 0819) Cardiac Rhythm: Sinus tachycardia (03/07 0819) Resp:  [18-24] 18 (03/07 0345) BP: (93-158)/(59-97) 93/59 (03/07 0347) SpO2:  [94 %-99 %] 95 % (03/07 0835)  Intake/Output from previous day: 03/06 0701 - 03/07 0700 In: -  Out: 1310 [Urine:1100; Chest Tube:210] Intake/Output this shift: Total I/O In: 180 [P.O.:180] Out: 90 [Chest Tube:90]  General appearance: alert, cooperative and no distress Heart: regular rate and rhythm Lungs: clear to auscultation bilaterally Abdomen: soft, non-tender; bowel sounds normal; no masses,  no organomegaly Wound: clean and dry  Lab Results: Recent Labs    07/24/19 0249 07/25/19 0221  WBC 17.6* 22.2*  HGB 13.3 12.9*  HCT 38.9* 37.4*  PLT 173 196   BMET:  Recent Labs    07/24/19 0249 07/25/19 0221  NA 134* 134*  K 3.7 3.9  CL 99 97*  CO2 25 28  GLUCOSE 184* 111*  BUN 12 8  CREATININE 0.85 0.82  CALCIUM 8.3* 8.3*    PT/INR:  Recent Labs    07/23/19 0415  LABPROT 14.5  INR 1.1   ABG    Component Value Date/Time   PHART 7.453 (H) 07/24/2019 0515   HCO3 26.7 07/24/2019 0515   TCO2 29 07/21/2019 1102   O2SAT 95.0 07/24/2019 0515   CBG (last 3)  No results for input(s): GLUCAP in the last 72 hours.  Assessment/Plan: S/P Procedure(s) (LRB): VIDEO ASSISTED THORACOSCOPY WITH TALC PLEURODESIS (Right) STAPLING OF BLEBS (Right)  1. CV- NSR, BP stable on Cozaar at home dose 2. Pulm- no air leak present, CT on suction.. CXR with slight increase in right pneumothorax, will discuss further management with Dr.  Cliffton Asters 3. D/C Foley 4. Lovenox for DVT prophylaxis 5. Dispo- patient stable, no air from chest tube on suction, however pneumothorax slightly increased on CXR this morning, will discuss further management with Dr. Cliffton Asters, repeat CXR in AM   LOS: 4 days    Lowella Dandy, PA-C  07/25/2019   Agree with above No leak Continue CT to suction for 24 more hrs Johonna Binette O Jerric Oyen

## 2019-07-25 NOTE — Plan of Care (Signed)

## 2019-07-26 ENCOUNTER — Inpatient Hospital Stay (HOSPITAL_COMMUNITY): Payer: No Typology Code available for payment source

## 2019-07-26 LAB — BASIC METABOLIC PANEL
Anion gap: 13 (ref 5–15)
BUN: 10 mg/dL (ref 6–20)
CO2: 22 mmol/L (ref 22–32)
Calcium: 8.6 mg/dL — ABNORMAL LOW (ref 8.9–10.3)
Chloride: 99 mmol/L (ref 98–111)
Creatinine, Ser: 0.75 mg/dL (ref 0.61–1.24)
GFR calc Af Amer: >60 mL/min (ref >60)
GFR calc non Af Amer: >60 mL/min (ref >60)
Glucose, Bld: 130 mg/dL — ABNORMAL HIGH (ref 70–99)
Potassium: 3.5 mmol/L (ref 3.5–5.1)
Sodium: 134 mmol/L — ABNORMAL LOW (ref 135–145)

## 2019-07-26 LAB — MAGNESIUM: Magnesium: 2 mg/dL (ref 1.7–2.4)

## 2019-07-26 MED ORDER — IPRATROPIUM-ALBUTEROL 0.5-2.5 (3) MG/3ML IN SOLN: 3.0000 mL | Freq: Four times a day (QID) | RESPIRATORY_TRACT | Status: DC | PRN

## 2019-07-26 MED ORDER — AMIODARONE LOAD VIA INFUSION
150.0000 mg | Freq: Once | INTRAVENOUS | Status: AC
  Administered 2019-07-26: 150 mg via INTRAVENOUS
  Filled 2019-07-26: qty 83.34

## 2019-07-26 MED ORDER — KETOROLAC TROMETHAMINE 15 MG/ML IJ SOLN
15.0000 mg | Freq: Four times a day (QID) | INTRAMUSCULAR | Status: AC | PRN
  Administered 2019-07-26 – 2019-07-28 (×7): 15 mg via INTRAVENOUS
  Filled 2019-07-26 (×7): qty 1

## 2019-07-26 MED ORDER — AMIODARONE IV BOLUS ONLY 150 MG/100ML: 150.0000 mg | Freq: Once | INTRAVENOUS | Status: DC

## 2019-07-26 MED ORDER — POTASSIUM CHLORIDE CRYS ER 20 MEQ PO TBCR
40.0000 meq | EXTENDED_RELEASE_TABLET | ORAL | Status: AC
  Administered 2019-07-26 (×2): 40 meq via ORAL
  Filled 2019-07-26 (×2): qty 2

## 2019-07-26 MED ORDER — AMIODARONE HCL IN DEXTROSE 360-4.14 MG/200ML-% IV SOLN
30.0000 mg/h | INTRAVENOUS | Status: DC
  Administered 2019-07-26 – 2019-07-28 (×4): 30 mg/h via INTRAVENOUS
  Filled 2019-07-26 (×4): qty 200

## 2019-07-26 MED ORDER — AMIODARONE HCL IN DEXTROSE 360-4.14 MG/200ML-% IV SOLN
60.0000 mg/h | INTRAVENOUS | Status: DC
  Administered 2019-07-26 (×2): 60 mg/h via INTRAVENOUS
  Filled 2019-07-26 (×2): qty 200

## 2019-07-26 NOTE — Progress Notes (Signed)
3 Days Post-Op Procedure(s) (LRB): VIDEO ASSISTED THORACOSCOPY WITH TALC PLEURODESIS (Right) STAPLING OF BLEBS (Right) Subjective: Awake and alert, says he feels OK. Denies shortness of breath.  Developed atrial fibrillation with RVR at ~3am. Started on amiodarone infusion without a bolus. Currently in AF in 140's to 160's.   Objective: Vital signs in last 24 hours: Temp:  [98.2 F (36.8 C)-99.5 F (37.5 C)] 98.7 F (37.1 C) (03/08 0300) Pulse Rate:  [76-147] 134 (03/08 0700) Cardiac Rhythm: Atrial fibrillation (03/08 0354) Resp:  [17-30] 24 (03/08 0700) BP: (108-143)/(70-96) 119/90 (03/08 0700) SpO2:  [91 %-98 %] 95 % (03/08 0718)   Intake/Output from previous day: 03/07 0701 - 03/08 0700 In: 282.3 [P.O.:180; I.V.:102.3] Out: 1220 [Urine:1100; Chest Tube:120] Intake/Output this shift: No intake/output data recorded.  General appearance: alert, cooperative and no distress Neurologic: intact Heart: Afib with VR 140-160's. Lungs: Breath sounds are clear. No air leak, no sub Q air,  minimal ct drainage.  Wound: The right chest incision is covered with a dry dressing.   Lab Results: Recent Labs    07/24/19 0249 07/25/19 0221  WBC 17.6* 22.2*  HGB 13.3 12.9*  HCT 38.9* 37.4*  PLT 173 196   BMET:  Recent Labs    07/24/19 0249 07/25/19 0221  NA 134* 134*  K 3.7 3.9  CL 99 97*  CO2 25 28  GLUCOSE 184* 111*  BUN 12 8  CREATININE 0.85 0.82  CALCIUM 8.3* 8.3*    PT/INR: No results for input(s): LABPROT, INR in the last 72 hours. ABG    Component Value Date/Time   PHART 7.453 (H) 07/24/2019 0515   HCO3 26.7 07/24/2019 0515   TCO2 29 07/21/2019 1102   O2SAT 95.0 07/24/2019 0515   CBG (last 3)  No results for input(s): GLUCAP in the last 72 hours.   EXAM: PORTABLE CHEST 1 VIEW  COMPARISON:  07/25/2019  FINDINGS: Right chest tubes remain in place. Lucency over the right apex felt represent residual right apical pneumothorax. The pleural edge  is difficult to visualize. Airspace opacity over the right apex and right lung base are unchanged. Heart is normal size. Left lung clear.  IMPRESSION: Stable appearance of the chest. Right chest tubes with continued right apical pneumothorax.   Electronically Signed   By: Charlett Nose M.D.   On: 07/26/2019 07:49  Assessment/Plan: S/P Procedure(s) (LRB): VIDEO ASSISTED THORACOSCOPY WITH TALC PLEURODESIS (Right) STAPLING OF BLEBS (Right)  -POD3 right VATS, bleb resection for PTX. No air leak. CXR continues to show an apical space. Leave CT's to suction one more day.   -Atrial fibrillation with RVR- new onste. IV amio load initiated early this AM. Still has VR in 140's to 160's. Will give 150mg  bolus now. Check BMP and Mg++ now.  Monitor.   -Leukocytosis- no fever, necrotic lung noted at time of surgery. Path pending. Currently on no ABX. Continue to monitor.   -DVT PPX- continue Lovenox.    LOS: 5 days    , PA-C [phone] 07/26/2019

## 2019-07-26 NOTE — Plan of Care (Signed)

## 2019-07-26 NOTE — Progress Notes (Signed)
3 Days Post-Op Procedure(s) (LRB): VIDEO ASSISTED THORACOSCOPY WITH TALC PLEURODESIS (Right) STAPLING OF BLEBS (Right) Subjective: No complaints Objective: Vital signs in last 24 hours: Temp:  [98 F (36.7 C)-99.5 F (37.5 C)] 98 F (36.7 C) (03/08 0801) Pulse Rate:  [74-147] 117 (03/08 0813) Cardiac Rhythm: Atrial fibrillation (03/08 0813) Resp:  [17-30] 21 (03/08 0812) BP: (106-140)/(70-96) 106/76 (03/08 0801) SpO2:  [91 %-98 %] 95 % (03/08 0801)  Hemodynamic parameters for last 24 hours:    Intake/Output from previous day: 03/07 0701 - 03/08 0700 In: 282.3 [P.O.:180; I.V.:102.3] Out: 1220 [Urine:1100; Chest Tube:120] Intake/Output this shift: No intake/output data recorded.  PE: Well-appearing, NAD CTA Irreg/irreg No edema Wounds dressed, dry Neuro: intact Lab Results: Recent Labs    07/24/19 0249 07/25/19 0221  WBC 17.6* 22.2*  HGB 13.3 12.9*  HCT 38.9* 37.4*  PLT 173 196   BMET:  Recent Labs    07/24/19 0249 07/25/19 0221  NA 134* 134*  K 3.7 3.9  CL 99 97*  CO2 25 28  GLUCOSE 184* 111*  BUN 12 8  CREATININE 0.85 0.82  CALCIUM 8.3* 8.3*    PT/INR: No results for input(s): LABPROT, INR in the last 72 hours. ABG    Component Value Date/Time   PHART 7.453 (H) 07/24/2019 0515   HCO3 26.7 07/24/2019 0515   TCO2 29 07/21/2019 1102   O2SAT 95.0 07/24/2019 0515   CBG (last 3)  No results for input(s): GLUCAP in the last 72 hours.  Assessment/Plan: S/P Procedure(s) (LRB): VIDEO ASSISTED THORACOSCOPY WITH TALC PLEURODESIS (Right) STAPLING OF BLEBS (Right) Mobilize amiodarone for afib  Leave CTs to suction one more day    LOS: 5 days    [name] 07/26/2019

## 2019-07-26 NOTE — Progress Notes (Signed)
Pt flipped into afib rvr with a rate as high as 175. Pt stated that he did not feel any change. Dr. Cliffton Asters paged and new orders were received. Will continue to monitor.

## 2019-07-26 NOTE — Progress Notes (Signed)
  Amiodarone Drug - Drug Interaction Consult Note  Recommendations: Monitor for QT prolongation.  Monitor electrolytes. Amiodarone is metabolized by the cytochrome P450 system and therefore has the potential to cause many drug interactions. Amiodarone has an average plasma half-life of 50 days (range 20 to 100 days).   There is potential for drug interactions to occur several weeks or months after stopping treatment and the onset of drug interactions may be slow after initiating amiodarone.   []  Statins: Increased risk of myopathy. Simvastatin- restrict dose to 20mg  daily. Other statins: counsel patients to report any muscle pain or weakness immediately.  []  Anticoagulants: Amiodarone can increase anticoagulant effect. Consider warfarin dose reduction. Patients should be monitored closely and the dose of anticoagulant altered accordingly, remembering that amiodarone levels take several weeks to stabilize.  []  Antiepileptics: Amiodarone can increase plasma concentration of phenytoin, the dose should be reduced. Note that small changes in phenytoin dose can result in large changes in levels. Monitor patient and counsel on signs of toxicity.  []  Beta blockers: increased risk of bradycardia, AV block and myocardial depression. Sotalol - avoid concomitant use.  []   Calcium channel blockers (diltiazem and verapamil): increased risk of bradycardia, AV block and myocardial depression.  []   Cyclosporine: Amiodarone increases levels of cyclosporine. Reduced dose of cyclosporine is recommended.  []  Digoxin dose should be halved when amiodarone is started.  []  Diuretics: increased risk of cardiotoxicity if hypokalemia occurs.  []  Oral hypoglycemic agents (glyburide, glipizide, glimepiride): increased risk of hypoglycemia. Patient's glucose levels should be monitored closely when initiating amiodarone therapy.   [x]  Drugs that prolong the QT interval:  Torsades de pointes risk may be increased with  concurrent use - avoid if possible.  Monitor QTc, also keep magnesium/potassium WNL if concurrent therapy can't be avoided. Antibiotics: e.g. fluoroquinolones, erythromycin. . Antiarrhythmics: e.g. quinidine, procainamide, disopyramide, sotalol. . Antipsychotics: e.g. phenothiazines, haloperidol.  . Lithium, tricyclic antidepressants, and methadone. Thank You,  Poteet  07/26/2019 3:27 AM

## 2019-07-27 ENCOUNTER — Inpatient Hospital Stay (HOSPITAL_COMMUNITY): Payer: No Typology Code available for payment source

## 2019-07-27 LAB — BASIC METABOLIC PANEL
Anion gap: 12 (ref 5–15)
BUN: 15 mg/dL (ref 6–20)
CO2: 24 mmol/L (ref 22–32)
Calcium: 8.6 mg/dL — ABNORMAL LOW (ref 8.9–10.3)
Chloride: 96 mmol/L — ABNORMAL LOW (ref 98–111)
Creatinine, Ser: 0.86 mg/dL (ref 0.61–1.24)
GFR calc Af Amer: >60 mL/min (ref >60)
GFR calc non Af Amer: >60 mL/min (ref >60)
Glucose, Bld: 115 mg/dL — ABNORMAL HIGH (ref 70–99)
Potassium: 3.9 mmol/L (ref 3.5–5.1)
Sodium: 132 mmol/L — ABNORMAL LOW (ref 135–145)

## 2019-07-27 LAB — SURGICAL PATHOLOGY

## 2019-07-27 LAB — CBC
HCT: 41.2 % (ref 39.0–52.0)
Hemoglobin: 14.1 g/dL (ref 13.0–17.0)
MCH: 31.6 pg (ref 26.0–34.0)
MCHC: 34.2 g/dL (ref 30.0–36.0)
MCV: 92.4 fL (ref 80.0–100.0)
Platelets: 269 10*3/uL (ref 150–400)
RBC: 4.46 MIL/uL (ref 4.22–5.81)
RDW: 12.7 % (ref 11.5–15.5)
WBC: 19.1 10*3/uL — ABNORMAL HIGH (ref 4.0–10.5)
nRBC: 0 % (ref 0.0–0.2)

## 2019-07-27 MED ORDER — METOPROLOL TARTRATE 12.5 MG HALF TABLET
12.5000 mg | ORAL_TABLET | Freq: Two times a day (BID) | ORAL | Status: DC
  Administered 2019-07-27 – 2019-08-05 (×19): 12.5 mg via ORAL
  Filled 2019-07-27 (×19): qty 1

## 2019-07-27 MED ORDER — GUAIFENESIN-DM 100-10 MG/5ML PO SYRP
5.0000 mL | ORAL_SOLUTION | ORAL | Status: DC | PRN
  Administered 2019-07-27 – 2019-07-28 (×3): 5 mL via ORAL
  Filled 2019-07-27 (×3): qty 5

## 2019-07-27 NOTE — Plan of Care (Signed)

## 2019-07-27 NOTE — Progress Notes (Signed)
Per Pt, Dr. Vickey Sages removed suction to [place], and said he will try and see how he goes.  No orders or instructions received.  I discussed this with Fayette Pho, PA this afternoon, and received nursing instruction to leave chest tube at water seal.

## 2019-07-27 NOTE — Progress Notes (Addendum)
4 Days Post-Op Procedure(s) (LRB): VIDEO ASSISTED THORACOSCOPY WITH TALC PLEURODESIS (Right) STAPLING OF BLEBS (Right) Subjective: Pain control is better with Toradol, Feels about the same otherwise.  Not short of breath on RA. Remains in atrial fibrillation on amiodarone infusion.   Objective: Vital signs in last 24 hours: Temp:  [97.4 F (36.3 C)-98.8 F (37.1 C)] 97.4 F (36.3 C) (03/09 0800) Pulse Rate:  [67-129] 111 (03/09 0800) Cardiac Rhythm: Atrial fibrillation (03/09 0800) Resp:  [12-25] 21 (03/09 0800) BP: (81-130)/(52-98) 123/93 (03/09 0800) SpO2:  [91 %-97 %] 97 % (03/09 0800)     Intake/Output from previous day: 03/08 0701 - 03/09 0700 In: 546.8 [P.O.:220; I.V.:326.8] Out: 0  Intake/Output this shift: No intake/output data recorded.  Physical Exam: General appearance: alert, cooperative and no distress Neurologic: intact Heart: Afib with VR 100-130's. Lungs: Breath sounds are clear, diminished on right. No air leak, no sub Q air,  minimal ct drainage.  Wound: The right chest incision is covered with a dry dressing.   Lab Results: Recent Labs    07/25/19 0221 07/27/19 0153  WBC 22.2* 19.1*  HGB 12.9* 14.1  HCT 37.4* 41.2  PLT 196 269   BMET:  Recent Labs    07/26/19 0913 07/27/19 0153  NA 134* 132*  K 3.5 3.9  CL 99 96*  CO2 22 24  GLUCOSE 130* 115*  BUN 10 15  CREATININE 0.75 0.86  CALCIUM 8.6* 8.6*    PT/INR: No results for input(s): LABPROT, INR in the last 72 hours. ABG    Component Value Date/Time   PHART 7.453 (H) 07/24/2019 0515   HCO3 26.7 07/24/2019 0515   TCO2 29 07/21/2019 1102   O2SAT 95.0 07/24/2019 0515   CBG (last 3)  No results for input(s): GLUCAP in the last 72 hours.  Assessment/Plan: S/P Procedure(s) (LRB): VIDEO ASSISTED THORACOSCOPY WITH TALC PLEURODESIS (Right) STAPLING OF BLEBS (Right)  -POD4 right VATS, bleb resection for PTX. No air leak. CXR continues to show an apical space and RUL density. Path is  pending.    -Atrial fibrillation with RVR- new onset. IV amio load initiated 3/8 and maintenance dose is infusing.. Still has VR at 100-130's.   Will add low-dose metoprolol since MAP is consistently ~52mmHg.  -Leukocytosis- no fever, necrotic lung noted at time of surgery.  Currently on no ABX. Continue working on pulmonary hygiene.   -DVT PPX- continue Lovenox.    LOS: 6 days    Leary Roca, PA-C 07/27/2019

## 2019-07-28 ENCOUNTER — Other Ambulatory Visit: Payer: Self-pay | Admitting: Cardiothoracic Surgery

## 2019-07-28 ENCOUNTER — Inpatient Hospital Stay (HOSPITAL_COMMUNITY): Payer: No Typology Code available for payment source

## 2019-07-28 DIAGNOSIS — J939 Pneumothorax, unspecified: Secondary | ICD-10-CM

## 2019-07-28 LAB — BASIC METABOLIC PANEL
Anion gap: 11 (ref 5–15)
BUN: 16 mg/dL (ref 6–20)
CO2: 25 mmol/L (ref 22–32)
Calcium: 8.4 mg/dL — ABNORMAL LOW (ref 8.9–10.3)
Chloride: 98 mmol/L (ref 98–111)
Creatinine, Ser: 0.96 mg/dL (ref 0.61–1.24)
GFR calc Af Amer: >60 mL/min (ref >60)
GFR calc non Af Amer: >60 mL/min (ref >60)
Glucose, Bld: 115 mg/dL — ABNORMAL HIGH (ref 70–99)
Potassium: 3.6 mmol/L (ref 3.5–5.1)
Sodium: 134 mmol/L — ABNORMAL LOW (ref 135–145)

## 2019-07-28 LAB — CBC
HCT: 38.6 % — ABNORMAL LOW (ref 39.0–52.0)
Hemoglobin: 13.2 g/dL (ref 13.0–17.0)
MCH: 31.9 pg (ref 26.0–34.0)
MCHC: 34.2 g/dL (ref 30.0–36.0)
MCV: 93.2 fL (ref 80.0–100.0)
Platelets: 310 10*3/uL (ref 150–400)
RBC: 4.14 MIL/uL — ABNORMAL LOW (ref 4.22–5.81)
RDW: 12.7 % (ref 11.5–15.5)
WBC: 20.8 10*3/uL — ABNORMAL HIGH (ref 4.0–10.5)
nRBC: 0 % (ref 0.0–0.2)

## 2019-07-28 MED ORDER — POTASSIUM CHLORIDE CRYS ER 20 MEQ PO TBCR
30.0000 meq | EXTENDED_RELEASE_TABLET | Freq: Two times a day (BID) | ORAL | Status: AC
  Administered 2019-07-28 (×2): 30 meq via ORAL
  Filled 2019-07-28 (×2): qty 1

## 2019-07-28 MED ORDER — AMIODARONE HCL 200 MG PO TABS
400.0000 mg | ORAL_TABLET | Freq: Two times a day (BID) | ORAL | Status: DC
  Administered 2019-07-28 – 2019-07-31 (×8): 400 mg via ORAL
  Filled 2019-07-28 (×8): qty 2

## 2019-07-28 MED ORDER — APIXABAN 5 MG PO TABS
5.0000 mg | ORAL_TABLET | Freq: Two times a day (BID) | ORAL | Status: DC
  Administered 2019-07-28 – 2019-08-05 (×17): 5 mg via ORAL
  Filled 2019-07-28 (×17): qty 1

## 2019-07-28 NOTE — TOC Progression Note (Addendum)
Transition of Care (TOC) - Progression Note    Patient Details  Name: Caleb Young. MRN: [id number] Date of Birth: 14-Nov-1965  Transition of Care [facility]) CM/SW Contact  Leone Haven, RN Phone Number: 07/28/2019, 4:16 PM  Clinical Narrative:    NCM received vm from April with [facility] , stating patient is with Lenn Sink, PCP Charna Archer, pager [phone]  [phone] ext [phone].  Patient states to leave him a hard copy of the eliquis script.  Will need to fax to PCP the dc summary and copy for script for eliquis. Patient gets his meds free thru the [facility].  Please send the first 30 day eliquis to the [facility] pharmacy.    Expected Discharge Plan: Home/Self Care Barriers to Discharge: Continued Medical Work up  Expected Discharge Plan and Services Expected Discharge Plan: Home/Self Care   Discharge Planning Services: CM Consult   Living arrangements for the past 2 months: Mobile Home                                       Social Determinants of Health (SDOH) Interventions    Readmission Risk Interventions No flowsheet data found.

## 2019-07-28 NOTE — Progress Notes (Signed)
ANTICOAGULATION CONSULT NOTE - Initial Consult  Pharmacy Consult for apixaban Indication: atrial fibrillation  Allergies  Allergen Reactions  . Bee Venom Shortness Of Breath  . Codeine     "made my heart stop"  . Gabapentin Palpitations    tremors    Patient Measurements: Height: 6\' 4"  (193 cm) Weight: 175 lb 4.3 oz (79.5 kg) IBW/kg (Calculated) : 86.8  Vital Signs: Temp: 97.5 F (36.4 C) (03/10 0803) Temp Source: Oral (03/10 0803) BP: 110/75 (03/10 0803) Pulse Rate: 90 (03/10 0803)  Labs: Recent Labs    07/26/19 0913 07/27/19 0153 07/28/19 0222  HGB  --  14.1 13.2  HCT  --  41.2 38.6*  PLT  --  269 310  CREATININE 0.75 0.86 0.96    Estimated Creatinine Clearance: 98.9 mL/min (by C-G formula based on SCr of 0.96 mg/dL).   Medical History: Past Medical History:  Diagnosis Date  . Tension pneumothorax 07/22/2019   Assessment: 54 year old presents with spontaneous pneumothorax, now s/p VATS.   Patient with afib postop, was continued on lovenox for px until stable. New orders to transition to apixaban this morning. Hgb has been stable, currently 13.2, plt wnl. No overt bleeding noted.   Goal of Therapy:  Monitor platelets by anticoagulation protocol: Yes   Plan:  Apixaban 5mg  bid Consulted TOC for copay information  56 PharmD., BCPS Clinical Pharmacist 07/28/2019 8:12 AM

## 2019-07-28 NOTE — Discharge Instructions (Signed)
Video-Assisted Thoracic Surgery, Care After This sheet gives you information about how to care for yourself after your procedure. Your health care provider may also give you more specific instructions. If you have problems or questions, contact your health care provider. What can I expect after the procedure? After the procedure, it is common to have:  Some pain and soreness in your chest.  Pain when breathing in (inhaling) and coughing.  Constipation.  Fatigue.  Difficulty sleeping. Follow these instructions at home: Preventing pneumonia  Take deep breaths or do breathing exercises as instructed by your health care provider. Doing this helps prevent lung infection (pneumonia).  Cough frequently. Coughing may cause discomfort, but it is important to clear mucus (phlegm) and expand your lungs. If it hurts to cough, hold a pillow against your chest or place the palms of both hands on top of the incision (use splinting) when you cough. This may help relieve discomfort.  If you were given an incentive spirometer, use it as directed. An incentive spirometer is a tool that measures how well you are filling your lungs with each breath.  Participate in pulmonary rehabilitation as directed by your health care provider. This is a program that combines education, exercise, and support from a team of specialists. The goal is to help you heal and get back to your normal activities as soon as possible. Medicines  Take over-the-counter or prescription medicines only as told by your health care provider.  If you have pain, take pain-relieving medicine before your pain becomes severe. This is important because if your pain is under control, you will be able to breathe and cough more comfortably.  If you were prescribed an antibiotic medicine, take it as told by your health care provider. Do not stop taking the antibiotic even if you start to feel better. Activity  Ask your health care provider what  activities are safe for you.  Avoid activities that use your chest muscles for at least 3-4 weeks.  Do not lift anything that is heavier than 10 lb (4.5 kg), or the limit that your health care provider tells you, until he or she says that it is safe. Incision care  Follow instructions from your health care provider about how to take care of your incision(s). Make sure you: ? Wash your hands with soap and water before you change your bandage (dressing). If soap and water are not available, use hand sanitizer. ? Change your dressing as told by your health care provider. ? Leave stitches (sutures), skin glue, or adhesive strips in place. These skin closures may need to stay in place for 2 weeks or longer. If adhesive strip edges start to loosen and curl up, you may trim the loose edges. Do not remove adhesive strips completely unless your health care provider tells you to do that.  Keep your dressing dry until it has been removed.  Check your incision area every day for signs of infection. Check for: ? Redness, swelling, or pain. ? Fluid or blood. ? Warmth. ? Pus or a bad smell. Bathing  Do not take baths, swim, or use a hot tub until your health care provider approves. You may take showers.  After your dressing has been removed, use soap and water to gently wash your incision area. Do not use anything else to clean your incision(s) unless your health care provider tells you to do this. Driving   Do not drive until your health care provider approves.  Do not drive or  use heavy machinery while taking prescription pain medicine. Eating and drinking  Eat a healthy, balanced diet as instructed by your health care provider. A healthy diet includes plenty of fresh fruits and vegetables, whole grains, and low-fat (lean) proteins.  Limit foods that are high in fat and processed sugars, such as fried and sweet foods.  Drink enough fluid to keep your urine clear or pale yellow. General  instructions   To prevent or treat constipation while you are taking prescription pain medicine, your health care provider may recommend that you: ? Take over-the-counter or prescription medicines. ? Eat foods that are high in fiber, such as beans, fresh fruits and vegetables, and whole grains.  Do not use any products that contain nicotine or tobacco, such as cigarettes and e-cigarettes. If you need help quitting, ask your health care provider.  Avoid secondhand smoke.  Wear compression stockings as told by your health care provider. These stockings help to prevent blood clots and reduce swelling in your legs.  If you have a chest tube, care for it as instructed by your health care provider. Do not travel by airplane during the 2 weeks after your chest tube is removed, or until your health care provider says that this is safe.  Keep all follow-up visits as told by your health care provider. This is important. Contact a health care provider if:  You have redness, swelling, or pain around an incision.  You have fluid or blood coming from an incision.  Your incision area feels warm to the touch.  You have pus or a bad smell coming from an incision.  You have a fever or chills.  You have nausea or vomiting.  You have pain that does not get better with medicine. Get help right away if:  You have chest pain.  Your heart is fluttering or beating rapidly.  You develop a rash.  You have shortness of breath or trouble breathing.  You are confused.  You have trouble speaking.  You feel weak, light-headed, or dizzy.  You faint. Summary  To help prevent lung infection (pneumonia), take deep breaths or do breathing exercises as instructed by your health care provider.  Cough frequently to clear mucus (phlegm) and expand your lungs. If it hurts to cough, hold a pillow against your chest or place the palms of both hands on top of the incision (use splinting) when you cough.  If  you have pain, take pain-relieving medicine before your pain becomes severe. This is important because if your pain is under control, you will be able to breathe and cough more comfortably.  Ask your health care provider what activities are safe for you. This information is not intended to replace advice given to you by your health care provider. Make sure you discuss any questions you have with your health care provider. Document Revised: 04/18/2017 Document Reviewed: 04/15/2016 Elsevier Patient Education  2020 [employer].           Information on my medicine - ELIQUIS (apixaban)  This medication education was reviewed with me or my healthcare representative as part of my discharge preparation.  The pharmacist that spoke with me during my hospital stay was:  Roselie Awkward, RPH  Why was Eliquis prescribed for you? Eliquis was prescribed for you to reduce the risk of a blood clot forming that can cause a stroke if you have a medical condition called atrial fibrillation (a type of irregular heartbeat).  What do You need to know  about Eliquis ? Take your Eliquis TWICE DAILY - one tablet in the morning and one tablet in the evening with or without food. If you have difficulty swallowing the tablet whole please discuss with your pharmacist how to take the medication safely.  Take Eliquis exactly as prescribed by your doctor and DO NOT stop taking Eliquis without talking to the doctor who prescribed the medication.  Stopping may increase your risk of developing a stroke.  Refill your prescription before you run out.  After discharge, you should have regular check-up appointments with your healthcare provider that is prescribing your Eliquis.  In the future your dose may need to be changed if your kidney function or weight changes by a significant amount or as you get older.  What do you do if you miss a dose? If you miss a dose, take it as soon as you remember on the same day and  resume taking twice daily.  Do not take more than one dose of ELIQUIS at the same time to make up a missed dose.  Important Safety Information A possible side effect of Eliquis is bleeding. You should call your healthcare provider right away if you experience any of the following: ? Bleeding from an injury or your nose that does not stop. ? Unusual colored urine (red or dark brown) or unusual colored stools (red or black). ? Unusual bruising for unknown reasons. ? A serious fall or if you hit your head (even if there is no bleeding).  Some medicines may interact with Eliquis and might increase your risk of bleeding or clotting while on Eliquis. To help avoid this, consult your healthcare provider or pharmacist prior to using any new prescription or non-prescription medications, including herbals, vitamins, non-steroidal anti-inflammatory drugs (NSAIDs) and supplements.  This website has more information on Eliquis (apixaban): http://www.eliquis.com/eliquis/home

## 2019-07-28 NOTE — Progress Notes (Signed)
5 Days Post-Op Procedure(s) (LRB): VIDEO ASSISTED THORACOSCOPY WITH TALC PLEURODESIS (Right) STAPLING OF BLEBS (Right) Subjective: Feels much better since chest tubes removed this AM. Has some mild shortness of breath but this has improved.   Objective: Vital signs in last 24 hours: Temp:  [97.5 F (36.4 C)-98.5 F (36.9 C)] 97.8 F (36.6 C) (03/10 1038) Pulse Rate:  [77-128] 77 (03/10 1038) Cardiac Rhythm: Atrial fibrillation (03/10 0700) Resp:  [16-22] 18 (03/10 1038) BP: (95-132)/(62-90) 109/83 (03/10 1038) SpO2:  [95 %-99 %] 99 % (03/10 1038)     Intake/Output from previous day: 03/09 0701 - 03/10 0700 In: 1483 [P.O.:840; I.V.:643] Out: 1220 [Urine:1150; Chest Tube:70] Intake/Output this shift: No intake/output data recorded.  Physical Exam: General appearance:alert, cooperative and no distress Neurologic:intact Heart:Afib with VR now 80-100 Lungs:Breath sounds are clear, diminished on right. CT's removed. Site dry. Wound:The right chest incision is covered with a dry dressing.  Lab Results: Recent Labs    07/27/19 0153 07/28/19 0222  WBC 19.1* 20.8*  HGB 14.1 13.2  HCT 41.2 38.6*  PLT 269 310   BMET:  Recent Labs    07/27/19 0153 07/28/19 0222  NA 132* 134*  K 3.9 3.6  CL 96* 98  CO2 24 25  GLUCOSE 115* 115*  BUN 15 16  CREATININE 0.86 0.96  CALCIUM 8.6* 8.4*    PT/INR: No results for input(s): LABPROT, INR in the last 72 hours. ABG    Component Value Date/Time   PHART 7.453 (H) 07/24/2019 0515   HCO3 26.7 07/24/2019 0515   TCO2 29 07/21/2019 1102   O2SAT 95.0 07/24/2019 0515   CBG (last 3)  No results for input(s): GLUCAP in the last 72 hours.  EXAM: PORTABLE CHEST 1 VIEW  COMPARISON:  07/28/2019  FINDINGS: Interval removal of right chest tubes. Stable lucency over the right apex, likely pneumothorax, stable. Stable opacity in the right upper lung and peripherally throughout the right mid and lower lung. Linear atelectasis  at the left base. Heart is normal size.  IMPRESSION: Interval removal of right chest tube with stable appearance of the right chest. Lucency over the right apex likely reflects residual pneumothorax, unchanged.   Electronically Signed   By: Charlett Nose M.D.   On: 07/28/2019 10:00   Assessment/Plan: S/P Procedure(s) (LRB): VIDEO ASSISTED THORACOSCOPY WITH TALC PLEURODESIS (Right) STAPLING OF BLEBS (Right)  -POD5 right VATS, bleb resection for PTX. No air leak x 3 days. CXR continues to show an apical space and RUL density. CT's removed this AM, post removal CXR stable. Check PA/Lat CXR in am.  Path showed "necrotic lung" and "emphysema".  -Atrial fibrillation -on IV amio, metoprolol. Rate control better. Amio converted to PO today. Add Eliquis.  -Leukocytosis- no fever, necrotic lung noted at time of surgery.  Currently on no ABX. Continue working on pulmonary hygiene.   -DVT PPX- d/c lovenox since starting Eliquis.  LOS: 7 days      Leary Roca, [place] [phone] 07/28/2019

## 2019-07-28 NOTE — Plan of Care (Signed)
  Problem: Education: Goal: Knowledge of General Education information will improve Description: Including pain rating scale, medication(s)/side effects and non-pharmacologic comfort measures Outcome: Adequate for Discharge   Problem: Health Behavior/Discharge Planning: Goal: Ability to manage health-related needs will improve Outcome: Adequate for Discharge   Problem: Clinical Measurements: Goal: Ability to maintain clinical measurements within normal limits will improve Outcome: Adequate for Discharge Goal: Will remain free from infection Outcome: Adequate for Discharge Goal: Diagnostic test results will improve Outcome: Adequate for Discharge Goal: Respiratory complications will improve Outcome: Adequate for Discharge Goal: Cardiovascular complication will be avoided Outcome: Adequate for Discharge   Problem: Activity: Goal: Risk for activity intolerance will decrease Outcome: Adequate for Discharge   Problem: Nutrition: Goal: Adequate nutrition will be maintained Outcome: Adequate for Discharge   Problem: Coping: Goal: Level of anxiety will decrease Outcome: Adequate for Discharge   Problem: Elimination: Goal: Will not experience complications related to bowel motility Outcome: Adequate for Discharge   Problem: Pain Managment: Goal: General experience of comfort will improve Outcome: Adequate for Discharge   Problem: Safety: Goal: Ability to remain free from injury will improve Outcome: Adequate for Discharge   Problem: Skin Integrity: Goal: Risk for impaired skin integrity will decrease Outcome: Adequate for Discharge   

## 2019-07-29 ENCOUNTER — Inpatient Hospital Stay (HOSPITAL_COMMUNITY): Payer: No Typology Code available for payment source

## 2019-07-29 DIAGNOSIS — J930 Spontaneous tension pneumothorax: Secondary | ICD-10-CM | POA: Diagnosis not present

## 2019-07-29 LAB — BASIC METABOLIC PANEL
Anion gap: 12 (ref 5–15)
BUN: 12 mg/dL (ref 6–20)
CO2: 25 mmol/L (ref 22–32)
Calcium: 8.3 mg/dL — ABNORMAL LOW (ref 8.9–10.3)
Chloride: 96 mmol/L — ABNORMAL LOW (ref 98–111)
Creatinine, Ser: 1.04 mg/dL (ref 0.61–1.24)
GFR calc Af Amer: >60 mL/min (ref >60)
GFR calc non Af Amer: >60 mL/min (ref >60)
Glucose, Bld: 120 mg/dL — ABNORMAL HIGH (ref 70–99)
Potassium: 3.7 mmol/L (ref 3.5–5.1)
Sodium: 133 mmol/L — ABNORMAL LOW (ref 135–145)

## 2019-07-29 LAB — CBC
HCT: 37.1 % — ABNORMAL LOW (ref 39.0–52.0)
Hemoglobin: 12.6 g/dL — ABNORMAL LOW (ref 13.0–17.0)
MCH: 31.7 pg (ref 26.0–34.0)
MCHC: 34 g/dL (ref 30.0–36.0)
MCV: 93.5 fL (ref 80.0–100.0)
Platelets: 392 10*3/uL (ref 150–400)
RBC: 3.97 MIL/uL — ABNORMAL LOW (ref 4.22–5.81)
RDW: 12.9 % (ref 11.5–15.5)
WBC: 21.9 10*3/uL — ABNORMAL HIGH (ref 4.0–10.5)
nRBC: 0 % (ref 0.0–0.2)

## 2019-07-29 MED ORDER — SODIUM CHLORIDE 0.9 % IV SOLN
2.0000 g | Freq: Two times a day (BID) | INTRAVENOUS | Status: DC
  Administered 2019-07-29 (×2): 2 g via INTRAVENOUS
  Filled 2019-07-29 (×4): qty 2

## 2019-07-29 MED ORDER — POTASSIUM CHLORIDE CRYS ER 20 MEQ PO TBCR
30.0000 meq | EXTENDED_RELEASE_TABLET | Freq: Two times a day (BID) | ORAL | Status: AC
  Administered 2019-07-29 (×2): 30 meq via ORAL
  Filled 2019-07-29 (×2): qty 1

## 2019-07-29 NOTE — Plan of Care (Signed)
  Problem: Education: Goal: Knowledge of General Education information will improve Description: Including pain rating scale, medication(s)/side effects and non-pharmacologic comfort measures Outcome: Progressing   Problem: Health Behavior/Discharge Planning: Goal: Ability to manage health-related needs will improve Outcome: Progressing   Problem: Clinical Measurements: Goal: Will remain free from infection Outcome: Progressing Goal: Diagnostic test results will improve Outcome: Progressing   Problem: Activity: Goal: Risk for activity intolerance will decrease Outcome: Progressing   Problem: Nutrition: Goal: Adequate nutrition will be maintained Outcome: Progressing   Problem: Coping: Goal: Level of anxiety will decrease Outcome: Progressing   Problem: Safety: Goal: Ability to remain free from injury will improve Outcome: Progressing   

## 2019-07-29 NOTE — Plan of Care (Signed)

## 2019-07-29 NOTE — Progress Notes (Signed)
6 Days Post-Op Procedure(s) (LRB): VIDEO ASSISTED THORACOSCOPY WITH TALC PLEURODESIS (Right) STAPLING OF BLEBS (Right) Subjective: More comfortable today but has quite a bit of pain with cough.  Says cough is less productive today.   Objective: Vital signs in last 24 hours: Temp:  [98.1 F (36.7 C)-99 F (37.2 C)] 98.7 F (37.1 C) (03/11 1215) Pulse Rate:  [93-127] 127 (03/11 1215) Cardiac Rhythm: Atrial fibrillation;Atrial flutter (03/11 0703) Resp:  [14-23] 14 (03/11 1426) BP: (92-144)/(64-93) 121/93 (03/11 1215) SpO2:  [97 %-99 %] 99 % (03/10 1932)     Intake/Output from previous day: 03/10 0701 - 03/11 0700 In: 790.5 [P.O.:720; I.V.:70.5] Out: -  Intake/Output this shift: Total I/O In: 100 [IV Piggyback:100] Out: -   Physical Exam: General appearance:alert, cooperative and no distress Neurologic:intact Heart:Afib with VR 100-125 Lungs:Breath sounds are clear, diminished on right. The incision and CT sites are dry.  CXR shows a stable air fluid level on the right.   Lab Results: Recent Labs    07/28/19 0222 07/29/19 0220  WBC 20.8* 21.9*  HGB 13.2 12.6*  HCT 38.6* 37.1*  PLT 310 392   BMET:  Recent Labs    07/28/19 0222 07/29/19 0220  NA 134* 133*  K 3.6 3.7  CL 98 96*  CO2 25 25  GLUCOSE 115* 120*  BUN 16 12  CREATININE 0.96 1.04  CALCIUM 8.4* 8.3*    PT/INR: No results for input(s): LABPROT, INR in the last 72 hours. ABG    Component Value Date/Time   PHART 7.453 (H) 07/24/2019 0515   HCO3 26.7 07/24/2019 0515   TCO2 29 07/21/2019 1102   O2SAT 95.0 07/24/2019 0515   CBG (last 3)  No results for input(s): GLUCAP in the last 72 hours.   EXAM: CHEST - 2 VIEW  COMPARISON:  07/28/2019  FINDINGS: Cardiomediastinal contours are stable. Loculated right hydropneumothorax is unchanged accounting for differences in positioning. Opacity along the minor fissure in the right chest likely from previous right chest tube.  Right basilar  airspace disease similar to the previous study. Left chest is clear.  No acute bone finding.  IMPRESSION: Moderately large right apical hydropneumothorax with similar appearance accounting for differences in positioning as compared to the exam of 07/28/2019.  Basilar airspace disease and signs of prior chest tube placement also with similar appearance.   Electronically Signed   By: Donzetta Kohut M.D.   On: 07/29/2019 08:03   Assessment/Plan: S/P Procedure(s) (LRB): VIDEO ASSISTED THORACOSCOPY WITH TALC PLEURODESIS (Right) STAPLING OF BLEBS (Right)  -POD6right VATS, bleb resection for PTX. CT's out 3/10. CXR continues to show a stable apical space and RUL density.  Path showed "necrotic lung" and "emphysema".   -Atrial fibrillation -on oral amio, metoprolol. Rate control better. Continue Eliquis. Supplement K+.   -Leukocytosis- no fever, necrotic lung noted at time of surgery. Empiric Maxipime added today.  Continue working on pulmonary hygiene.  -DVT PPX- d/c'd lovenox since starting Eliquis.   LOS: 8 days     Leary Roca, [place] [phone] 07/29/2019

## 2019-07-30 ENCOUNTER — Inpatient Hospital Stay (HOSPITAL_COMMUNITY): Payer: No Typology Code available for payment source

## 2019-07-30 LAB — BASIC METABOLIC PANEL
Anion gap: 15 (ref 5–15)
BUN: 15 mg/dL (ref 6–20)
CO2: 23 mmol/L (ref 22–32)
Calcium: 8.9 mg/dL (ref 8.9–10.3)
Chloride: 96 mmol/L — ABNORMAL LOW (ref 98–111)
Creatinine, Ser: 0.93 mg/dL (ref 0.61–1.24)
GFR calc Af Amer: >60 mL/min (ref >60)
GFR calc non Af Amer: >60 mL/min (ref >60)
Glucose, Bld: 108 mg/dL — ABNORMAL HIGH (ref 70–99)
Potassium: 5.2 mmol/L — ABNORMAL HIGH (ref 3.5–5.1)
Sodium: 134 mmol/L — ABNORMAL LOW (ref 135–145)

## 2019-07-30 LAB — CBC
HCT: 39.2 % (ref 39.0–52.0)
Hemoglobin: 13 g/dL (ref 13.0–17.0)
MCH: 31.5 pg (ref 26.0–34.0)
MCHC: 33.2 g/dL (ref 30.0–36.0)
MCV: 94.9 fL (ref 80.0–100.0)
Platelets: 458 10*3/uL — ABNORMAL HIGH (ref 150–400)
RBC: 4.13 MIL/uL — ABNORMAL LOW (ref 4.22–5.81)
RDW: 13.1 % (ref 11.5–15.5)
WBC: 32.9 10*3/uL — ABNORMAL HIGH (ref 4.0–10.5)
nRBC: 0 % (ref 0.0–0.2)

## 2019-07-30 MED ORDER — FENTANYL CITRATE (PF) 100 MCG/2ML IJ SOLN
INTRAMUSCULAR | Status: AC
  Filled 2019-07-30: qty 2

## 2019-07-30 MED ORDER — FENTANYL CITRATE (PF) 100 MCG/2ML IJ SOLN
INTRAMUSCULAR | Status: AC | PRN
  Administered 2019-07-30: 50 ug via INTRAVENOUS

## 2019-07-30 MED ORDER — SODIUM CHLORIDE 0.9 % IV SOLN
2.0000 g | Freq: Three times a day (TID) | INTRAVENOUS | Status: DC
  Administered 2019-07-30 – 2019-08-04 (×15): 2 g via INTRAVENOUS
  Filled 2019-07-30 (×18): qty 2

## 2019-07-30 NOTE — Procedures (Signed)
Interventional Radiology Procedure Note  Procedure: CT right posterior chest tube 12 fr  Complications: None  Estimated Blood Loss: min  Findings: Pus aspirated.  80cc. Labs sent

## 2019-07-30 NOTE — Plan of Care (Signed)

## 2019-07-30 NOTE — Consult Note (Signed)
Chief Complaint: Patient was seen in consultation today for right hemithorax fluid collection/right chest tube placement.  Referring Physician(s): Leary Roca  Supervising Physician: Ruel Favors  Patient Status: [facility] - In-pt  History of Present Illness: Caleb Chelf. is a 54 y.o. male with an unremarkable past medical history who presented to [facility] ED 07/21/2019 with complaint of severe right back pain. In ED, CT chest revealed a right tension pneumothorax and a chest tube was inserted. He was admitted for further management. He continued to have residual pneumothorax for 36 hours post-chest tube insertion, therefore CT surgery was consulted for further management. He then underwent VATS with talc pleurodesis, stapling of blebs, and chest tube insertion x2 in OR 07/23/2019 by Dr. Vickey Sages. Chest tubes were removed 07/28/2019 AM. Follow-up CT chest this AM revealed a large post-op fluid collection in the right hemithorax.  CT chest 07/30/2019: 1. Postprocedural changes of recent talc pleurodesis and bleb stapling in the left lung, as above. Large postoperative gas and fluid collection with complexity in the apical aspect of the right hemithorax, as detailed above. The possibility of infected fluid within this collection should be considered. 2. Aortic atherosclerosis, in addition to left main and 2 vessel coronary artery disease. Please note that although the presence of coronary artery calcium documents the presence of coronary artery disease, the severity of this disease and any potential stenosis cannot be assessed on this non-gated CT examination. Assessment for potential risk factor modification, dietary therapy or pharmacologic therapy may be warranted, if clinically indicated. 3. Ectasia of ascending thoracic aorta (4.3 cm in diameter). Recommend annual imaging followup by CTA or MRA.   IR requested by Jillyn Hidden, PA-C for possible image-guided right chest tube  placement. Patient awake and alert sitting in chair. Complains of dyspnea, stable since surgery. Complains of right chest pain, rated 7/10 at this time. States pain is 10/10 when he touches his right chest. Denies fever, chills, abdominal pain, or headache.   Past Medical History:  Diagnosis Date  . Tension pneumothorax 07/22/2019    Past Surgical History:  Procedure Laterality Date  . STAPLING OF BLEBS Right 07/23/2019   Procedure: STAPLING OF BLEBS;  Surgeon: [name], MD;  Location: [facility] OR;  Service: Thoracic;  Laterality: Right;  Marland Kitchen VIDEO ASSISTED THORACOSCOPY Right 07/23/2019   Procedure: VIDEO ASSISTED THORACOSCOPY WITH TALC PLEURODESIS;  Surgeon: [name], MD;  Location: [facility] OR;  Service: Thoracic;  Laterality: Right;    Allergies: Bee venom, Codeine, and Gabapentin  Medications: Prior to Admission medications   Medication Sig Start Date End Date Taking? Authorizing Provider  baclofen (LIORESAL) 10 MG tablet Take 5 mg by mouth at bedtime.   Yes [provider]  Ensure (ENSURE) Take 237 mLs by mouth 3 (three) times daily between meals.   Yes [provider]  loperamide (IMODIUM) 2 MG capsule Take 2 mg by mouth as needed for diarrhea or loose stools.   Yes [provider]  losartan (COZAAR) 25 MG tablet Take 12.5 mg by mouth daily.   Yes [provider]  tiZANidine (ZANAFLEX) 4 MG tablet Take 4 mg by mouth at bedtime.    Yes [provider]     History reviewed. No pertinent family history.  Social History   Socioeconomic History  . Marital status: Married    Spouse name: Not on file  . Number of children: Not on file  . Years of education: Not on file  . Highest education level:  Not on file  Occupational History  . Not on file  Tobacco Use  . Smoking status: Not on file  Substance and Sexual Activity  . Alcohol use: Not on file  . Drug use: Not on file  . Sexual activity: Not on file  Other Topics Concern   . Not on file  Social History Narrative  . Not on file   Social Determinants of Health   Financial Resource Strain:   . Difficulty of Paying Living Expenses:   Food Insecurity:   . Worried About Programme researcher, broadcasting/film/video in the Last Year:   . Barista in the Last Year:   Transportation Needs:   . Freight forwarder (Medical):   Marland Kitchen Lack of Transportation (Non-Medical):   Physical Activity:   . Days of Exercise per Week:   . Minutes of Exercise per Session:   Stress:   . Feeling of Stress :   Social Connections:   . Frequency of Communication with Friends and Family:   . Frequency of Social Gatherings with Friends and Family:   . Attends Religious Services:   . Active Member of Clubs or Organizations:   . Attends Banker Meetings:   Marland Kitchen Marital Status:      Review of Systems: A 12 point ROS discussed and pertinent positives are indicated in the HPI above.  All other systems are negative.  Review of Systems  Constitutional: Negative for chills and fever.  Respiratory: Positive for shortness of breath. Negative for wheezing.   Cardiovascular: Positive for chest pain. Negative for palpitations.  Gastrointestinal: Negative for abdominal pain.  Neurological: Negative for headaches.  Psychiatric/Behavioral: Negative for behavioral problems and confusion.    Vital Signs: BP 139/89 (BP Location: Left Leg)   Pulse 76   Temp 98.7 F (37.1 C) (Oral)   Resp 20   Ht 6\' 4"  (1.93 m)   Wt 175 lb 4.3 oz (79.5 kg)   SpO2 97%   BMI 21.33 kg/m   Physical Exam Vitals and nursing note reviewed.  Constitutional:      General: He is not in acute distress.    Appearance: Normal appearance.  Cardiovascular:     Rate and Rhythm: Normal rate and regular rhythm.     Heart sounds: Normal heart sounds. No murmur.  Pulmonary:     Effort: Pulmonary effort is normal. No respiratory distress.     Breath sounds: Normal breath sounds. No wheezing.  Skin:    General: Skin is  warm and dry.  Neurological:     Mental Status: He is alert and oriented to person, place, and time.  Psychiatric:        Mood and Affect: Mood normal.        Behavior: Behavior normal.       Imaging: DG Chest 2 View  Result Date: 07/30/2019 CLINICAL DATA:  Pleural effusion EXAM: CHEST - 2 VIEW COMPARISON:  07/29/2019 FINDINGS: No significant change in loculated moderate volume hydropneumothorax about the right pulmonary apex. Additional loculated pleural effusion about the right lung base. Left lung is normally aerated. The visualized skeletal structures are unremarkable. IMPRESSION: No significant change in loculated moderate volume hydropneumothorax about the right pulmonary apex. Additional loculated pleural effusion about the right lung base. No new airspace opacity. The left lung is normally aerated. Electronically Signed   By: 09/28/2019 M.D.   On: 07/30/2019 08:32   DG Chest 2 View  Result Date: 07/29/2019 CLINICAL DATA:  Pneumothorax.  Status post removal of right-sided chest tube. EXAM: CHEST - 2 VIEW COMPARISON:  07/28/2019 FINDINGS: Cardiomediastinal contours are stable. Loculated right hydropneumothorax is unchanged accounting for differences in positioning. Opacity along the minor fissure in the right chest likely from previous right chest tube. Right basilar airspace disease similar to the previous study. Left chest is clear. No acute bone finding. IMPRESSION: Moderately large right apical hydropneumothorax with similar appearance accounting for differences in positioning as compared to the exam of 07/28/2019. Basilar airspace disease and signs of prior chest tube placement also with similar appearance. Electronically Signed   By: Zetta Bills M.D.   On: 07/29/2019 08:03   CT CHEST WO CONTRAST  Result Date: 07/30/2019 CLINICAL DATA:  54 year old male with history of shortness of breath. Right-sided chest pain. Hydropneumothorax with worsening leukocytosis. Status post VATS and  right-sided talc pleurodesis 07/23/2019. EXAM: CT CHEST WITHOUT CONTRAST TECHNIQUE: Multidetector CT imaging of the chest was performed following the standard protocol without IV contrast. COMPARISON:  Chest CT 07/27/2019. FINDINGS: Cardiovascular: Heart size is normal. There is no significant pericardial fluid, thickening or pericardial calcification. There is aortic atherosclerosis, as well as atherosclerosis of the great vessels of the mediastinum and the coronary arteries, including calcified atherosclerotic plaque in the left main, left anterior descending and left circumflex coronary arteries. Ectasia of ascending thoracic aorta (4.3 cm in diameter). Mediastinum/Nodes: Multiple prominent borderline enlarged and mildly enlarged mediastinal lymph nodes, largest of which is a low right paratracheal lymph node measuring 1.8 cm in short axis (axial image 62 of series 4), increased compared to the recent prior examination, presumably reactive. Esophagus is unremarkable in appearance. No axillary lymphadenopathy. Lungs/Pleura: New suture line in the right upper lobe adjacent to a large loculated gas and fluid collection in the apex of the upper right hemithorax, with a large layering air-fluid level, as well as extensive gas bubbles throughout the dependent portions of the fluid, suggesting complexity in the fluid with potential loculations and/or infection. Scattered areas of atelectasis throughout the right lower lobe and right middle lobe. Small amount of gas and fluid tracking in the major and minor fissures. Linear scarring or subsegmental atelectasis in the left lower lobe. Left lung is otherwise clear. Mild paraseptal emphysema. Previously noted right-sided chest tube has been removed. Small amount Upper Abdomen: Aortic atherosclerosis. Musculoskeletal: Subcutaneous emphysema in the right chest wall tracking cephalad into the right axillary region. There are no aggressive appearing lytic or blastic lesions  noted in the visualized portions of the skeleton. IMPRESSION: 1. Postprocedural changes of recent talc pleurodesis and bleb stapling in the left lung, as above. Large postoperative gas and fluid collection with complexity in the apical aspect of the right hemithorax, as detailed above. The possibility of infected fluid within this collection should be considered. 2. Aortic atherosclerosis, in addition to left main and 2 vessel coronary artery disease. Please note that although the presence of coronary artery calcium documents the presence of coronary artery disease, the severity of this disease and any potential stenosis cannot be assessed on this non-gated CT examination. Assessment for potential risk factor modification, dietary therapy or pharmacologic therapy may be warranted, if clinically indicated. 3. Ectasia of ascending thoracic aorta (4.3 cm in diameter). Recommend annual imaging followup by CTA or MRA. This recommendation follows 2010 ACCF/AHA/AATS/ACR/ASA/SCA/SCAI/SIR/STS/SVM Guidelines for the Diagnosis and Management of Patients with Thoracic Aortic Disease. Circulation. 2010; 121: B017-P102. Aortic aneurysm NOS (ICD10-I71.9). Aortic Atherosclerosis (ICD10-I70.0). Electronically Signed   By: Vinnie Langton M.D.   On:  07/30/2019 10:13   CT CHEST WO CONTRAST  Result Date: 07/21/2019 CLINICAL DATA:  Right pneumothorax. Decreased breath sounds on the right. EXAM: CT CHEST WITHOUT CONTRAST TECHNIQUE: Multidetector CT imaging of the chest was performed following the standard protocol without IV contrast. COMPARISON:  Chest x-ray and CT urogram earlier today FINDINGS: Cardiovascular: Ascending thoracic aorta slightly aneurysmal at 4 cm. Extensive coronary artery calcifications. Scattered aortic calcifications. Heart is normal size. Mediastinum/Nodes: Borderline size mediastinal lymph nodes. Right paratracheal lymph node has a short axis diameter of 9 mm. Other similarly sized and smaller mediastinal  lymph nodes. No axillary or hilar adenopathy. Lungs/Pleura: Right side chest tube in place. Moderate-sized residual right pneumothorax. Paraseptal emphysema noted within the upper lobes bilaterally. Small right pleural effusion. Airspace disease in the right middle lobe and right lower lobe could reflect atelectasis or pneumonia. Minimal dependent atelectasis in the left lung. Upper Abdomen: Imaging into the upper abdomen shows no acute findings. Stomach is moderately distended with fluid. Musculoskeletal: Chest wall soft tissues are unremarkable. No acute bony abnormality. IMPRESSION: Right chest tube in place with moderate residual right pneumothorax. Small right pleural effusion. Right middle and lower lobe atelectasis or pneumonia. Paraseptal emphysema. Extensive coronary artery disease. Moderate distention of the stomach with fluid. Aortic Atherosclerosis (ICD10-I70.0) and Emphysema (ICD10-J43.9). Electronically Signed   By: Charlett Nose M.D.   On: 07/21/2019 14:00   DG Chest Port 1 View  Result Date: 07/28/2019 CLINICAL DATA:  Chest tube removal EXAM: PORTABLE CHEST 1 VIEW COMPARISON:  07/28/2019 FINDINGS: Interval removal of right chest tubes. Stable lucency over the right apex, likely pneumothorax, stable. Stable opacity in the right upper lung and peripherally throughout the right mid and lower lung. Linear atelectasis at the left base. Heart is normal size. IMPRESSION: Interval removal of right chest tube with stable appearance of the right chest. Lucency over the right apex likely reflects residual pneumothorax, unchanged. Electronically Signed   By: Charlett Nose M.D.   On: 07/28/2019 10:00   DG CHEST PORT 1 VIEW  Result Date: 07/28/2019 CLINICAL DATA:  Pneumothorax with chest tube EXAM: PORTABLE CHEST 1 VIEW COMPARISON:  Yesterday FINDINGS: Two right-sided chest tubes are in similar position. Postoperative right lung with hazy apical opacity and apical lucency suggesting pneumothorax, although  the pleural line is will not clearly defined. The left lung is clear. Normal heart size. IMPRESSION: Stable postoperative right chest with apical lucency most likely reflecting pneumothorax. Electronically Signed   By: Marnee Spring M.D.   On: 07/28/2019 08:51   DG CHEST PORT 1 VIEW  Result Date: 07/27/2019 CLINICAL DATA:  Chest tube and pneumothorax. EXAM: PORTABLE CHEST 1 VIEW COMPARISON:  July 26, 2019. FINDINGS: The heart size and mediastinal contours are within normal limits. Left lung is clear. Stable position of 2 right-sided chest tubes. Lucency is noted in the right lung apex concerning for pneumothorax, although pleural margin is not identified. Postsurgical changes are noted in the right upper lobe with associated atelectasis or contusion. Right basilar atelectasis and effusion is noted. The visualized skeletal structures are unremarkable. IMPRESSION: Stable position of 2 right-sided chest tubes. Lucency is noted in right lung apex concerning for pneumothorax, although pleural margin is not identified. Right basilar atelectasis and effusion is noted. Electronically Signed   By: Lupita Raider M.D.   On: 07/27/2019 09:03   DG CHEST PORT 1 VIEW  Result Date: 07/26/2019 CLINICAL DATA:  Chest tube, shortness of breath EXAM: PORTABLE CHEST 1 VIEW COMPARISON:  07/25/2019 FINDINGS:  Right chest tubes remain in place. Lucency over the right apex felt represent residual right apical pneumothorax. The pleural edge is difficult to visualize. Airspace opacity over the right apex and right lung base are unchanged. Heart is normal size. Left lung clear. IMPRESSION: Stable appearance of the chest. Right chest tubes with continued right apical pneumothorax. Electronically Signed   By: Charlett Nose M.D.   On: 07/26/2019 07:49   DG CHEST PORT 1 VIEW  Result Date: 07/25/2019 CLINICAL DATA:  54 year old male with history of tension pneumothorax. Follow-up study. EXAM: PORTABLE CHEST 1 VIEW COMPARISON:  Chest x-ray  07/24/2019. FINDINGS: Two right-sided chest tubes are stable in position. Persistent small right apical pneumothorax, minimally increased compared to yesterday's examination. Suture line near the apex of the right lung again noted. Probable trace volume of right-sided pleural fluid. Left lung is clear. No left pleural effusion. No evidence of pulmonary edema. Heart size is normal. Upper mediastinal contours are within normal limits. IMPRESSION: 1. Two right-sided chest tubes are stable in position. Previously noted right-sided hydropneumothorax appears slightly increased in size compared to yesterday's examination. Electronically Signed   By: Trudie Reed M.D.   On: 07/25/2019 06:06   DG CHEST PORT 1 VIEW  Result Date: 07/24/2019 CLINICAL DATA:  54 year old male status post Thoracic surgery. Follow-up examination. EXAM: PORTABLE CHEST 1 VIEW COMPARISON:  Chest x-ray 07/23/2019. FINDINGS: Right-sided chest tubes appear similarly positioned with tips and side ports projecting over the right hemithorax. Small residual right apical pneumothorax, decreased in size compared to the prior study. Suture lines projecting over the right upper lobe near the apex. Small volume of right-sided pleural fluid. Left lung is clear. No left pleural effusion. No evidence of pulmonary edema. Heart size is normal. Upper mediastinal contours are within normal limits. IMPRESSION: 1. Postoperative changes and support apparatus, as above, similar to the prior study. Persistent but decreasing small right hydropneumothorax. Electronically Signed   By: Trudie Reed M.D.   On: 07/24/2019 10:53   DG Chest Port 1 View  Result Date: 07/23/2019 CLINICAL DATA:  54 year old male with follow-up pneumothorax. EXAM: PORTABLE CHEST 1 VIEW COMPARISON:  Earlier radiograph dated 07/22/2019. FINDINGS: There has been interval removal of the right pigtail chest tube and placement of two new chest tubes on the right. Interval increase in the size of  the right-sided pneumothorax now measuring approximately up to 2.7 cm to the right apical pleural surface. Right upper lobe surgical sutures noted. Interval decrease in the size of the right pleural effusion and associated atelectasis of the right lung base. Stable cardiomediastinal silhouette. No acute osseous pathology. IMPRESSION: Interval increase in the size of the right-sided pneumothorax since the prior radiograph. Continued close follow-up recommended. Electronically Signed   By: Elgie Collard M.D.   On: 07/23/2019 18:41   DG Chest Port 1 View  Result Date: 07/22/2019 CLINICAL DATA:  Right pneumothorax. Chest tube in place. EXAM: PORTABLE CHEST 1 VIEW COMPARISON:  Chest x-rays dated 07/22/2019 and 07/21/2019 and chest CT dated 07/21/2019 FINDINGS: There is a tiny residual right apical pneumothorax. Chest tube in good position. Moderate right pleural effusion, unchanged. Left lung is clear. Heart size and vascularity are normal. No significant bone abnormality. IMPRESSION: 1. Tiny residual right apical pneumothorax. 2. Stable moderate right pleural effusion. Electronically Signed   By: Francene Boyers M.D.   On: 07/22/2019 12:10   DG CHEST PORT 1 VIEW  Result Date: 07/22/2019 CLINICAL DATA:  Tension pneumothorax, chest tube EXAM: PORTABLE CHEST 1 VIEW  COMPARISON:  07/21/2019 FINDINGS: Right chest tube remains in place, unchanged. Tiny residual right apical pneumothorax. Airspace disease in the right mid and lower lung. Layering right effusion. Left lung clear. Heart is normal size. IMPRESSION: Right chest tube remains in place with tiny residual right apical pneumothorax, stable. Layering right effusion with right lower lobe atelectasis. Electronically Signed   By: Charlett NoseKevin  Dover M.D.   On: 07/22/2019 08:58   DG Chest Port 1 View  Result Date: 07/22/2019 CLINICAL DATA:  Chest pain EXAM: PORTABLE CHEST 1 VIEW COMPARISON:  July 21, 2019 FINDINGS: The heart size and mediastinal contours are within  normal limits. There is a right-sided pigtail catheter seen. There is a tiny right apical pneumothorax still present. Probable layering small right pleural effusion seen. The left lung is clear. No acute osseous abnormality. IMPRESSION: Residual tiny right apical pneumothorax. Layering right pleural effusion. Electronically Signed   By: Jonna ClarkBindu  Avutu M.D.   On: 07/22/2019 02:21   DG CHEST PORT 1 VIEW  Result Date: 07/21/2019 CLINICAL DATA:  RIGHT-side chest tube EXAM: PORTABLE CHEST 1 VIEW COMPARISON:  CT abdomen and pelvis 07/21/2019 FINDINGS: Interval placement of pigtail RIGHT thoracostomy tube with marked decrease in pneumothorax and resolution of mediastinal shift RIGHT to LEFT. Normal heart size and mediastinal contours. Residual atelectasis RIGHT base. LEFT lung clear. Tiny RIGHT pleural effusion. No acute osseous findings. IMPRESSION: Marked decrease in RIGHT pneumothorax and resolution of RIGHT to LEFT mediastinal shift since thoracostomy tube insertion. Residual atelectasis and minimal pleural effusion at RIGHT lung base. Electronically Signed   By: Ulyses SouthwardMark  Boles M.D.   On: 07/21/2019 12:37   CT Renal Stone Study  Result Date: 07/21/2019 CLINICAL DATA:  54 year old male with history of flank pain. Suspected kidney stone. EXAM: CT ABDOMEN AND PELVIS WITHOUT CONTRAST TECHNIQUE: Multidetector CT imaging of the abdomen and pelvis was performed following the standard protocol without IV contrast. COMPARISON:  No priors. FINDINGS: Lower chest: Large right pneumothorax with near complete collapse of the visualized right lung. Trace volume of right-sided pleural fluid lying dependently. There may be some mild mass effect on cardiomediastinal structures which appears slightly shifted to the left. Aortic atherosclerosis. Calcified atherosclerotic plaque also noted in the left circumflex coronary artery. Hepatobiliary: No definite suspicious cystic or solid hepatic lesions are confidently identified on today's  noncontrast CT examination. Atrophy of the left lobe of the liver. Calcified granuloma in the central liver. Unenhanced appearance of the gallbladder is unremarkable. Pancreas: No definite pancreatic mass or peripancreatic fluid collections or inflammatory changes are noted on today's noncontrast CT examination. Spleen: Unremarkable. Adrenals/Urinary Tract: No calculi are identified within the collecting system of either kidney, along the course of either ureter, or within the lumen of the urinary bladder. No hydroureteronephrosis. 3.3 x 2.8 x 3.1 cm low-attenuation lesion in the interpolar region of the right kidney, incompletely characterized on today's non-contrast CT examination, but statistically likely to represent a cyst. Unenhanced appearance of the left kidney and bilateral adrenal glands is unremarkable. Unenhanced appearance of the urinary bladder is normal. Stomach/Bowel: Unenhanced appearance of the stomach is normal. No pathologic dilatation of small bowel or colon. Normal appendix. Vascular/Lymphatic: Aortic atherosclerosis. No lymphadenopathy noted in the abdomen or pelvis. Reproductive: Prostate gland and seminal vesicles are unremarkable in appearance. Other: No significant volume of ascites.  No pneumoperitoneum. Musculoskeletal: There are no aggressive appearing lytic or blastic lesions noted in the visualized portions of the skeleton. IMPRESSION: 1. Large right-sided hydropneumothorax (large pneumothorax with trace volume of fluid)  which appears likely under tension. 2. No acute findings are noted in the abdomen or pelvis to account for the patient's symptoms. Specifically, no urinary tract calculi and no findings of urinary tract obstruction are noted at this time. 3. Aortic atherosclerosis, in addition to at least left circumflex coronary artery disease. Please note that although the presence of coronary artery calcium documents the presence of coronary artery disease, the severity of this  disease and any potential stenosis cannot be assessed on this non-gated CT examination. Assessment for potential risk factor modification, dietary therapy or pharmacologic therapy may be warranted, if clinically indicated. Critical Value/emergent results were called by telephone at the time of interpretation on 07/21/2019 at 11:33 am to provider [facility], who verbally acknowledged these results. Electronically Signed   By: Trudie Reed M.D.   On: 07/21/2019 11:33    Labs:  CBC: Recent Labs    07/27/19 0153 07/28/19 0222 07/29/19 0220 07/30/19 0134  WBC 19.1* 20.8* 21.9* 32.9*  HGB 14.1 13.2 12.6* 13.0  HCT 41.2 38.6* 37.1* 39.2  PLT 269 310 392 458*    COAGS: Recent Labs    07/23/19 0415  INR 1.1    BMP: Recent Labs    07/27/19 0153 07/28/19 0222 07/29/19 0220 07/30/19 0134  NA 132* 134* 133* 134*  K 3.9 3.6 3.7 5.2*  CL 96* 98 96* 96*  CO2 24 25 25 23   GLUCOSE 115* 115* 120* 108*  BUN 15 16 12 15   CALCIUM 8.6* 8.4* 8.3* 8.9  CREATININE 0.86 0.96 1.04 0.93  GFRNONAA >60 >60 >60 >60  GFRAA >60 >60 >60 >60    LIVER FUNCTION TESTS: Recent Labs    07/22/19 0109 07/25/19 0221  BILITOT 1.2 0.5  AST 23 15  ALT 15 11  ALKPHOS 49 69  PROT 6.1* 5.5*  ALBUMIN 3.4* 2.3*     Assessment and Plan:  Right hemithorax fluid collection. Plan for image-guided right chest tube placement tentatively for today in IR. Patient ate lunch today- per Dr. 09/21/19 will proceed with Fentanyl only. Afebrile. Ok to proceed with Eliquis use per Dr. 09/24/19. INR 1.1 07/23/2019.  Risks and benefits discussed with the patient including bleeding, infection, damage to adjacent structures, and sepsis. All of the patient's questions were answered, patient is agreeable to proceed. Consent signed and in chart.   Thank you for this interesting consult.  I greatly enjoyed meeting Gwen Edler. and look forward to participating in their care.  A copy of this report was sent to the  requesting provider on this date.  Electronically Signed: 09/22/2019, PA-C 07/30/2019, 3:11 PM   I spent a total of 40 Minutes in face to face in clinical consultation, greater than 50% of which was counseling/coordinating care for right hemithorax fluid collection/right chest tube placement.

## 2019-07-30 NOTE — Progress Notes (Signed)
Transported to IR  By wheelchair awake and alert.

## 2019-07-30 NOTE — Progress Notes (Signed)
Back from radiology by stretcher awake and alert.

## 2019-07-30 NOTE — Progress Notes (Signed)
PHARMACY NOTE:  ANTIMICROBIAL RENAL DOSAGE ADJUSTMENT  Current antimicrobial regimen includes a mismatch between antimicrobial dosage and estimated renal function.  As per policy approved by the Pharmacy & Therapeutics and Medical Executive Committees, the antimicrobial dosage will be adjusted accordingly.  Current antimicrobial dosage:  Cefepime 2g q12 hours  Indication: pneumonia, necrotic lung  Renal Function:  Estimated Creatinine Clearance: 102.1 mL/min (by C-G formula based on SCr of 0.93 mg/dL). []      On intermittent HD, scheduled: []      On CRRT    Antimicrobial dosage has been changed to:  Cefepime 2g q8 hours  Thank you for allowing pharmacy to be a part of this patient's care.  PharmD., BCPS Clinical Pharmacist 07/30/2019 7:49 AM

## 2019-07-30 NOTE — Progress Notes (Addendum)
7 Days Post-Op Procedure(s) (LRB): VIDEO ASSISTED THORACOSCOPY WITH TALC PLEURODESIS (Right) STAPLING OF BLEBS (Right) Subjective: Says he feel about the same.  Reports some drainage from the right chest incision.    Objective: Vital signs in last 24 hours: Temp:  [97.8 F (36.6 C)-98.9 F (37.2 C)] 98 F (36.7 C) (03/12 0725) Pulse Rate:  [80-127] 84 (03/12 0725) Cardiac Rhythm: Normal sinus rhythm (03/12 0700) Resp:  [14-32] 20 (03/12 0725) BP: (88-152)/(59-93) 139/89 (03/12 0725) SpO2:  [95 %-96 %] 95 % (03/12 0751)     Intake/Output from previous day: 03/11 0701 - 03/12 0700 In: 920 [P.O.:720; IV Piggyback:200] Out: -  Intake/Output this shift: No intake/output data recorded.  General appearance: alert, cooperative and mild distress Neurologic: intact Heart: Converted back to SR.  Lungs: Breath sounds clear, diminished on right. CXR shows stable moderate right apical PTX with air / fluid level and stable right pleural effusion.  Wound: there is a new short segment wound separation and maroon drainage from the right thoracotomey incision.  Lab Results: Recent Labs    07/29/19 0220 07/30/19 0134  WBC 21.9* 32.9*  HGB 12.6* 13.0  HCT 37.1* 39.2  PLT 392 458*   BMET:  Recent Labs    07/29/19 0220 07/30/19 0134  NA 133* 134*  K 3.7 5.2*  CL 96* 96*  CO2 25 23  GLUCOSE 120* 108*  BUN 12 15  CREATININE 1.04 0.93  CALCIUM 8.3* 8.9    PT/INR: No results for input(s): LABPROT, INR in the last 72 hours. ABG    Component Value Date/Time   PHART 7.453 (H) 07/24/2019 0515   HCO3 26.7 07/24/2019 0515   TCO2 29 07/21/2019 1102   O2SAT 95.0 07/24/2019 0515   CBG (last 3)  No results for input(s): GLUCAP in the last 72 hours.  Assessment/Plan: S/P Procedure(s) (LRB): VIDEO ASSISTED THORACOSCOPY WITH TALC PLEURODESIS (Right) STAPLING OF BLEBS (Right)  -POD7right VATS, bleb resection for PTX. CT's out 3/10. CXR continues to show a stable apical space and  RUL density. Path showed "necrotic lung" and "emphysema". Has worsening leukocytosis. Repeat chest CT today.  Continue empiric Maxipime.   -Atrial fibrillationConverted back to SR. Continue amiodarone. Continue Eliquis.  -DVT PPX-d/c'd lovenox since starting Eliquis.    Addendum: 11:50am The chest CT demonstrates a complex fluid collection in the right hemithorax suspicious for an infectious process. Will request image-guided drainage by IR today. Will also request Gram stain, cultures, and cytology on fluid samples.     LOS: 9 days    Leary Roca, [place] [phone] 07/30/2019

## 2019-07-31 ENCOUNTER — Inpatient Hospital Stay (HOSPITAL_COMMUNITY): Payer: No Typology Code available for payment source

## 2019-07-31 LAB — BASIC METABOLIC PANEL
Anion gap: 14 (ref 5–15)
BUN: 14 mg/dL (ref 6–20)
CO2: 21 mmol/L — ABNORMAL LOW (ref 22–32)
Calcium: 8.7 mg/dL — ABNORMAL LOW (ref 8.9–10.3)
Chloride: 97 mmol/L — ABNORMAL LOW (ref 98–111)
Creatinine, Ser: 0.81 mg/dL (ref 0.61–1.24)
GFR calc Af Amer: >60 mL/min (ref >60)
GFR calc non Af Amer: >60 mL/min (ref >60)
Glucose, Bld: 101 mg/dL — ABNORMAL HIGH (ref 70–99)
Potassium: 4.2 mmol/L (ref 3.5–5.1)
Sodium: 132 mmol/L — ABNORMAL LOW (ref 135–145)

## 2019-07-31 LAB — CBC
HCT: 38 % — ABNORMAL LOW (ref 39.0–52.0)
Hemoglobin: 12.7 g/dL — ABNORMAL LOW (ref 13.0–17.0)
MCH: 31.6 pg (ref 26.0–34.0)
MCHC: 33.4 g/dL (ref 30.0–36.0)
MCV: 94.5 fL (ref 80.0–100.0)
Platelets: 503 10*3/uL — ABNORMAL HIGH (ref 150–400)
RBC: 4.02 MIL/uL — ABNORMAL LOW (ref 4.22–5.81)
RDW: 12.9 % (ref 11.5–15.5)
WBC: 22.8 10*3/uL — ABNORMAL HIGH (ref 4.0–10.5)
nRBC: 0 % (ref 0.0–0.2)

## 2019-07-31 NOTE — Progress Notes (Signed)
8 Days Post-Op Procedure(s) (LRB): VIDEO ASSISTED THORACOSCOPY WITH TALC PLEURODESIS (Right) STAPLING OF BLEBS (Right) Subjective: Awake and alert, up in the bedside chair.  Says he feels much better today and that he had immediate relief when the abscess was initially drained by IR yesterday.   Objective: Vital signs in last 24 hours: Temp:  [97.4 F (36.3 C)-98.9 F (37.2 C)] 97.4 F (36.3 C) (03/13 0500) Pulse Rate:  [71-94] 80 (03/13 0500) Cardiac Rhythm: Normal sinus rhythm (03/13 0704) Resp:  [12-21] 12 (03/13 0500) BP: (90-161)/(66-91) 123/79 (03/13 0500) SpO2:  [95 %-100 %] 100 % (03/13 0500)     Intake/Output from previous day: 03/12 0701 - 03/13 0700 In: 850 [P.O.:550; IV Piggyback:300] Out: 1280 [Urine:1100; Chest Tube:180] Intake/Output this shift: No intake/output data recorded.  Physical Exam: General appearance: alert, cooperative and mild distress Neurologic: intact Heart: Remains in SR.  Lungs: Breath sounds clear, diminished on right. CXR is pending. Wound: the right thoracotomey incision is not draining.  Lab Results: Recent Labs    07/30/19 0134 07/31/19 0237  WBC 32.9* 22.8*  HGB 13.0 12.7*  HCT 39.2 38.0*  PLT 458* 503*   BMET:  Recent Labs    07/30/19 0134 07/31/19 0237  NA 134* 132*  K 5.2* 4.2  CL 96* 97*  CO2 23 21*  GLUCOSE 108* 101*  BUN 15 14  CREATININE 0.93 0.81  CALCIUM 8.9 8.7*    PT/INR: No results for input(s): LABPROT, INR in the last 72 hours. ABG    Component Value Date/Time   PHART 7.453 (H) 07/24/2019 0515   HCO3 26.7 07/24/2019 0515   TCO2 29 07/21/2019 1102   O2SAT 95.0 07/24/2019 0515   CBG (last 3)  No results for input(s): GLUCAP in the last 72 hours.  Assessment/Plan: S/P Procedure(s) (LRB): VIDEO ASSISTED THORACOSCOPY WITH TALC PLEURODESIS (Right) STAPLING OF BLEBS (Right)  -POD8right VATS, bleb resection for PTX.CT's out 3/10.Path showed "necrotic lung" and "emphysema".  -POD1 drainage of  abscess from right chest by IR. The pigtail catheter has drained ~123ml purulent fluid since placement.  Cultures pending, continue empiric Maxipime.   -Atrial fibrillationConverted back to SR. Continue amiodarone. ContinueEliquis.  -DVT PPX-d/c'dlovenox since now on Eliquis.   LOS: 10 days    Leary Roca, [place] [phone] 07/31/2019

## 2019-07-31 NOTE — Progress Notes (Signed)
Referring Physician(s): Enid Cutter  Supervising Physician: Daryll Brod  Patient Status:  [facility] - In-pt  Chief Complaint: Follow up right chest tube placement 07/30/19 by Dr. Annamaria Boots  Subjective:  Patient sitting up in chair watching TV, waiting to ambulate in the hallway with his nurse. Some pain at chest tube insertion site but he states that he instantly felt much better once the tube was placed. He is extremely pleased with the care he has received at [facility], however he is hopeful that he won't have to be here much longer.   Allergies: Bee venom, Codeine, and Gabapentin  Medications: Prior to Admission medications   Medication Sig Start Date End Date Taking? Authorizing Provider  baclofen (LIORESAL) 10 MG tablet Take 5 mg by mouth at bedtime.   Yes [provider]  Ensure (ENSURE) Take 237 mLs by mouth 3 (three) times daily between meals.   Yes [provider]  loperamide (IMODIUM) 2 MG capsule Take 2 mg by mouth as needed for diarrhea or loose stools.   Yes [provider]  losartan (COZAAR) 25 MG tablet Take 12.5 mg by mouth daily.   Yes [provider]  tiZANidine (ZANAFLEX) 4 MG tablet Take 4 mg by mouth at bedtime.    Yes [provider]     Vital Signs: BP (!) 145/83 (BP Location: Left Arm)    Pulse 88    Temp 98.7 F (37.1 C) (Oral)    Resp 15    Ht 6\' 4"  (1.93 m)    Wt 175 lb 4.3 oz (79.5 kg)    SpO2 100%    BMI 21.33 kg/m   Physical Exam Vitals and nursing note reviewed.  Constitutional:      General: He is not in acute distress. HENT:     Head: Normocephalic.  Cardiovascular:     Rate and Rhythm: Normal rate.  Pulmonary:     Effort: Pulmonary effort is normal.     Breath sounds: Normal breath sounds.     Comments: (+) right posterior chest tube to suction with ~1600 cc serosanguineous OP. No obvious air leak. Insertion site clean, dry, dressed appropriately without leakage or bleeding. No SQ emphysema.  Appropriately tender to palpation.  Abdominal:     Palpations: Abdomen is soft.  Skin:    General: Skin is warm and dry.  Neurological:     Mental Status: He is alert. Mental status is at baseline.  Psychiatric:        Mood and Affect: Mood normal.        Behavior: Behavior normal.        Thought Content: Thought content normal.     Imaging: DG Chest 2 View  Result Date: 07/30/2019 CLINICAL DATA:  Pleural effusion EXAM: CHEST - 2 VIEW COMPARISON:  07/29/2019 FINDINGS: No significant change in loculated moderate volume hydropneumothorax about the right pulmonary apex. Additional loculated pleural effusion about the right lung base. Left lung is normally aerated. The visualized skeletal structures are unremarkable. IMPRESSION: No significant change in loculated moderate volume hydropneumothorax about the right pulmonary apex. Additional loculated pleural effusion about the right lung base. No new airspace opacity. The left lung is normally aerated. Electronically Signed   By: Eddie Candle M.D.   On: 07/30/2019 08:32   DG Chest 2 View  Result Date: 07/29/2019 CLINICAL DATA:  Pneumothorax. Status post removal of right-sided chest tube. EXAM: CHEST - 2 VIEW COMPARISON:  07/28/2019 FINDINGS: Cardiomediastinal contours are stable. Loculated right hydropneumothorax  is unchanged accounting for differences in positioning. Opacity along the minor fissure in the right chest likely from previous right chest tube. Right basilar airspace disease similar to the previous study. Left chest is clear. No acute bone finding. IMPRESSION: Moderately large right apical hydropneumothorax with similar appearance accounting for differences in positioning as compared to the exam of 07/28/2019. Basilar airspace disease and signs of prior chest tube placement also with similar appearance. Electronically Signed   By: Donzetta Kohut M.D.   On: 07/29/2019 08:03   CT CHEST WO CONTRAST  Result Date: 07/30/2019 CLINICAL DATA:   54 year old male with history of shortness of breath. Right-sided chest pain. Hydropneumothorax with worsening leukocytosis. Status post VATS and right-sided talc pleurodesis 07/23/2019. EXAM: CT CHEST WITHOUT CONTRAST TECHNIQUE: Multidetector CT imaging of the chest was performed following the standard protocol without IV contrast. COMPARISON:  Chest CT 07/27/2019. FINDINGS: Cardiovascular: Heart size is normal. There is no significant pericardial fluid, thickening or pericardial calcification. There is aortic atherosclerosis, as well as atherosclerosis of the great vessels of the mediastinum and the coronary arteries, including calcified atherosclerotic plaque in the left main, left anterior descending and left circumflex coronary arteries. Ectasia of ascending thoracic aorta (4.3 cm in diameter). Mediastinum/Nodes: Multiple prominent borderline enlarged and mildly enlarged mediastinal lymph nodes, largest of which is a low right paratracheal lymph node measuring 1.8 cm in short axis (axial image 62 of series 4), increased compared to the recent prior examination, presumably reactive. Esophagus is unremarkable in appearance. No axillary lymphadenopathy. Lungs/Pleura: New suture line in the right upper lobe adjacent to a large loculated gas and fluid collection in the apex of the upper right hemithorax, with a large layering air-fluid level, as well as extensive gas bubbles throughout the dependent portions of the fluid, suggesting complexity in the fluid with potential loculations and/or infection. Scattered areas of atelectasis throughout the right lower lobe and right middle lobe. Small amount of gas and fluid tracking in the major and minor fissures. Linear scarring or subsegmental atelectasis in the left lower lobe. Left lung is otherwise clear. Mild paraseptal emphysema. Previously noted right-sided chest tube has been removed. Small amount Upper Abdomen: Aortic atherosclerosis. Musculoskeletal: Subcutaneous  emphysema in the right chest wall tracking cephalad into the right axillary region. There are no aggressive appearing lytic or blastic lesions noted in the visualized portions of the skeleton. IMPRESSION: 1. Postprocedural changes of recent talc pleurodesis and bleb stapling in the left lung, as above. Large postoperative gas and fluid collection with complexity in the apical aspect of the right hemithorax, as detailed above. The possibility of infected fluid within this collection should be considered. 2. Aortic atherosclerosis, in addition to left main and 2 vessel coronary artery disease. Please note that although the presence of coronary artery calcium documents the presence of coronary artery disease, the severity of this disease and any potential stenosis cannot be assessed on this non-gated CT examination. Assessment for potential risk factor modification, dietary therapy or pharmacologic therapy may be warranted, if clinically indicated. 3. Ectasia of ascending thoracic aorta (4.3 cm in diameter). Recommend annual imaging followup by CTA or MRA. This recommendation follows 2010 ACCF/AHA/AATS/ACR/ASA/SCA/SCAI/SIR/STS/SVM Guidelines for the Diagnosis and Management of Patients with Thoracic Aortic Disease. Circulation. 2010; 121: T517-O160. Aortic aneurysm NOS (ICD10-I71.9). Aortic Atherosclerosis (ICD10-I70.0). Electronically Signed   By: Trudie Reed M.D.   On: 07/30/2019 10:13   DG CHEST PORT 1 VIEW  Result Date: 07/31/2019 CLINICAL DATA:  Postop check EXAM: PORTABLE CHEST 1 VIEW  COMPARISON:  CT chest dated 07/30/2019 FINDINGS: Postsurgical changes in the right upper hemithorax with associated interval placement of a pigtail drain. Prior fluid in the apical pleural space has resolved. However, there is a small apical pneumothorax. Trace basilar pleural fluid. Additional mild patchy right upper and lower lobe opacities, subpleural/peripheral, likely atelectasis. Left lung is clear. The heart is  normal in size. IMPRESSION: Postsurgical changes in the right hemithorax with interval placement of an apical pigtail drain. Prior apical pleural fluid has resolved. Small right apical pneumothorax.  Trace basilar pleural fluid. Mild patchy opacities in the right hemithorax, likely atelectasis. Electronically Signed   By: Charline Bills M.D.   On: 07/31/2019 09:40   DG Chest Port 1 View  Result Date: 07/28/2019 CLINICAL DATA:  Chest tube removal EXAM: PORTABLE CHEST 1 VIEW COMPARISON:  07/28/2019 FINDINGS: Interval removal of right chest tubes. Stable lucency over the right apex, likely pneumothorax, stable. Stable opacity in the right upper lung and peripherally throughout the right mid and lower lung. Linear atelectasis at the left base. Heart is normal size. IMPRESSION: Interval removal of right chest tube with stable appearance of the right chest. Lucency over the right apex likely reflects residual pneumothorax, unchanged. Electronically Signed   By: Charlett Nose M.D.   On: 07/28/2019 10:00   DG CHEST PORT 1 VIEW  Result Date: 07/28/2019 CLINICAL DATA:  Pneumothorax with chest tube EXAM: PORTABLE CHEST 1 VIEW COMPARISON:  Yesterday FINDINGS: Two right-sided chest tubes are in similar position. Postoperative right lung with hazy apical opacity and apical lucency suggesting pneumothorax, although the pleural line is will not clearly defined. The left lung is clear. Normal heart size. IMPRESSION: Stable postoperative right chest with apical lucency most likely reflecting pneumothorax. Electronically Signed   By: Marnee Spring M.D.   On: 07/28/2019 08:51   CT IMAGE GUIDED DRAINAGE BY PERCUTANEOUS CATHETER  Result Date: 07/30/2019 INDICATION: Loculated right apical empyema EXAM: CT-GUIDED POSTERIOR RIGHT CHEST TUBE INSERTION (12 FRENCH) MEDICATIONS: The patient is currently admitted to the hospital and receiving intravenous antibiotics. The antibiotics were administered within an appropriate time  frame prior to the initiation of the procedure. ANESTHESIA/SEDATION: Fentanyl 50 mcg IV Moderate Sedation Time:  NONE. The patient was continuously monitored during the procedure by the interventional radiology nurse under my direct supervision. COMPLICATIONS: None immediate. PROCEDURE: Informed written consent was obtained from the patient after a thorough discussion of the procedural risks, benefits and alternatives. All questions were addressed. Maximal Sterile Barrier Technique was utilized including caps, mask, sterile gowns, sterile gloves, sterile drape, hand hygiene and skin antiseptic. A timeout was performed prior to the initiation of the procedure. Previous imaging reviewed. Patient position prone. Noncontrast localization CT performed. The right apical pleural air-fluid collection was localized and marked for a posterior approach. Under sterile conditions and local anesthesia, CT guidance utilized to advance an 18 gauge 10 cm access needle into the right apical pleural air-fluid collection. Needle position confirmed with CT. Syringe aspiration yielded purulent fluid. Sample sent for laboratory analysis, culture and cytology. Guidewire inserted followed by tract dilatation to insert a 12 [place] drain. Drain catheter position confirmed with CT. Catheter secured with Prolene suture and connected to external pleura vac. Sterile dressing applied. No immediate complication. Patient tolerated the procedure well. IMPRESSION: Successful CT-guided posterior right chest tube insertion within the loculated right apical empyema. Sample sent for culture and cytology Electronically Signed   By: Judie Petit.  Shick M.D.   On: 07/30/2019 16:24    Labs:  CBC: Recent Labs    07/28/19 0222 07/29/19 0220 07/30/19 0134 07/31/19 0237  WBC 20.8* 21.9* 32.9* 22.8*  HGB 13.2 12.6* 13.0 12.7*  HCT 38.6* 37.1* 39.2 38.0*  PLT 310 392 458* 503*    COAGS: Recent Labs    07/23/19 0415  INR 1.1    BMP: Recent Labs     07/28/19 0222 07/29/19 0220 07/30/19 0134 07/31/19 0237  NA 134* 133* 134* 132*  K 3.6 3.7 5.2* 4.2  CL 98 96* 96* 97*  CO2 25 25 23  21*  GLUCOSE 115* 120* 108* 101*  BUN 16 12 15 14   CALCIUM 8.4* 8.3* 8.9 8.7*  CREATININE 0.96 1.04 0.93 0.81  GFRNONAA >60 >60 >60 >60  GFRAA >60 >60 >60 >60    LIVER FUNCTION TESTS: Recent Labs    07/22/19 0109 07/25/19 0221  BILITOT 1.2 0.5  AST 23 15  ALT 15 11  ALKPHOS 49 69  PROT 6.1* 5.5*  ALBUMIN 3.4* 2.3*    Assessment and Plan:  54 y/o M s/p right chest tube placement 07/30/19 in IR for recurrent pneumothorax seen today for routine follow up.  Patient reports significant improvement in symptoms post chest tube placement, he is looking forward to walking in the hall today. Insertion site unremarkable, dressed appropriately. ~1500 serosanguineous fluid in pleurvac on exam today - per I/O 180 cc recorded so far today. No obvious air leak or SQ emphysema, normal breath sounds bilaterally. Cx of aspirate pending - not currently on abx therapy. Tmax 98.9, spO2 100% on RA, WBC improved to 22.8 (was 32.9 yesterday), hgb 12.7, plt 503.  Further plans per cardiothoracic surgery - IR remains available as needed, please call with questions or concerns.  Electronically Signed: 57, PA-C 07/31/2019, 11:26 AM   I spent a total of 15 Minutes at the the patient's bedside AND on the patient's hospital floor or unit, greater than 50% of which was counseling/coordinating care for right chest tube follow up.

## 2019-08-01 ENCOUNTER — Encounter (HOSPITAL_COMMUNITY): Payer: Self-pay | Admitting: Cardiothoracic Surgery

## 2019-08-01 ENCOUNTER — Inpatient Hospital Stay (HOSPITAL_COMMUNITY): Payer: No Typology Code available for payment source

## 2019-08-01 LAB — CBC
HCT: 37.8 % — ABNORMAL LOW (ref 39.0–52.0)
Hemoglobin: 12.6 g/dL — ABNORMAL LOW (ref 13.0–17.0)
MCH: 31.6 pg (ref 26.0–34.0)
MCHC: 33.3 g/dL (ref 30.0–36.0)
MCV: 94.7 fL (ref 80.0–100.0)
Platelets: 564 10*3/uL — ABNORMAL HIGH (ref 150–400)
RBC: 3.99 MIL/uL — ABNORMAL LOW (ref 4.22–5.81)
RDW: 12.6 % (ref 11.5–15.5)
WBC: 23.1 10*3/uL — ABNORMAL HIGH (ref 4.0–10.5)
nRBC: 0 % (ref 0.0–0.2)

## 2019-08-01 LAB — BASIC METABOLIC PANEL
Anion gap: 11 (ref 5–15)
BUN: 13 mg/dL (ref 6–20)
CO2: 25 mmol/L (ref 22–32)
Calcium: 9.1 mg/dL (ref 8.9–10.3)
Chloride: 95 mmol/L — ABNORMAL LOW (ref 98–111)
Creatinine, Ser: 0.77 mg/dL (ref 0.61–1.24)
GFR calc Af Amer: >60 mL/min (ref >60)
GFR calc non Af Amer: >60 mL/min (ref >60)
Glucose, Bld: 97 mg/dL (ref 70–99)
Potassium: 4.7 mmol/L (ref 3.5–5.1)
Sodium: 131 mmol/L — ABNORMAL LOW (ref 135–145)

## 2019-08-01 MED ORDER — AMIODARONE HCL 200 MG PO TABS
200.0000 mg | ORAL_TABLET | Freq: Every day | ORAL | Status: DC
  Administered 2019-08-01 – 2019-08-05 (×5): 200 mg via ORAL
  Filled 2019-08-01 (×5): qty 1

## 2019-08-01 MED ORDER — VANCOMYCIN HCL 1500 MG/300ML IV SOLN
1500.0000 mg | Freq: Once | INTRAVENOUS | Status: AC
  Administered 2019-08-01: 1500 mg via INTRAVENOUS
  Filled 2019-08-01: qty 300

## 2019-08-01 MED ORDER — VANCOMYCIN HCL 1500 MG/300ML IV SOLN
1500.0000 mg | Freq: Two times a day (BID) | INTRAVENOUS | Status: DC
  Administered 2019-08-01 – 2019-08-03 (×5): 1500 mg via INTRAVENOUS
  Filled 2019-08-01 (×7): qty 300

## 2019-08-01 NOTE — Progress Notes (Signed)
Pharmacy Antibiotic Note  Caleb Young. is a 54 y.o. male admitted on 07/21/2019 with empyema.  Pharmacy has been consulted for vancomycin dosing.  Patient already receiving cefepime.   Plan: Vancomycin 1500 mg IV q 12 hrs.  Based on Scr 0.77, est CrCl ~ 490 F/u cultures, renal function and clinical course.  Height: 6\' 4"  (193 cm) Weight: 175 lb 4.3 oz (79.5 kg) IBW/kg (Calculated) : 86.8  Temp (24hrs), Avg:98 F (36.7 C), Min:97.6 F (36.4 C), Max:98.5 F (36.9 C)  Recent Labs  Lab 07/28/19 0222 07/29/19 0220 07/30/19 0134 07/31/19 0237 08/01/19 0301  WBC 20.8* 21.9* 32.9* 22.8* 23.1*  CREATININE 0.96 1.04 0.93 0.81 0.77    Estimated Creatinine Clearance: 118.7 mL/min (by C-G formula based on SCr of 0.77 mg/dL).    Allergies  Allergen Reactions  . Bee Venom Shortness Of Breath  . Codeine     "made my heart stop"  . Gabapentin Palpitations    tremors    Antimicrobials this admission: Cefepime 3/11>> Vancomycin 3/14 >  Dose adjustments this admission:  Microbiology results:  3/14 Pleural fluid >  Thank you for allowing pharmacy to be a part of this patient's care.  4/14, [facility] Clinical Pharmacist Phone [phone]  08/01/2019 1:08 PM

## 2019-08-01 NOTE — Progress Notes (Addendum)
9 Days Post-Op Procedure(s) (LRB): VIDEO ASSISTED THORACOSCOPY WITH TALC PLEURODESIS (Right) STAPLING OF BLEBS (Right) Subjective: Primary complaint is intermittent pain around the thoracotomy incision and his chronic back pain. He remains afebrile.   Objective: Vital signs in last 24 hours: Temp:  [97.6 F (36.4 C)-98.5 F (36.9 C)] 98.5 F (36.9 C) (03/14 0302) Pulse Rate:  [71-91] 71 (03/14 0302) Cardiac Rhythm: Normal sinus rhythm (03/14 0705) Resp:  [13-30] 20 (03/14 0302) BP: (90-144)/(63-91) 123/82 (03/14 0302) SpO2:  [96 %-100 %] 96 % (03/14 0302)     Intake/Output from previous day: 03/13 0701 - 03/14 0700 In: 1210 [P.O.:1210] Out: 1406 [Urine:1350; Chest Tube:56] Intake/Output this shift: No intake/output data recorded.  Physical Exam: General appearance:alert, cooperative and mild distress Neurologic:intact Heart:Remains in SR. Lungs:Breath sounds clear, diminished on right. CXR shows stable right apical PTX and partially cleared density at upper pole of the right lung. The drainage tube appears to be well positioned.  Wound:the right thoracotomey incision is now dry.   Lab Results: Recent Labs    07/31/19 0237 08/01/19 0301  WBC 22.8* 23.1*  HGB 12.7* 12.6*  HCT 38.0* 37.8*  PLT 503* 564*   BMET:  Recent Labs    07/31/19 0237 08/01/19 0301  NA 132* 131*  K 4.2 4.7  CL 97* 95*  CO2 21* 25  GLUCOSE 101* 97  BUN 14 13  CREATININE 0.81 0.77  CALCIUM 8.7* 9.1    PT/INR: No results for input(s): LABPROT, INR in the last 72 hours. ABG    Component Value Date/Time   PHART 7.453 (H) 07/24/2019 0515   HCO3 26.7 07/24/2019 0515   TCO2 29 07/21/2019 1102   O2SAT 95.0 07/24/2019 0515   CBG (last 3)  No results for input(s): GLUCAP in the last 72 hours.  EXAM: PORTABLE CHEST 1 VIEW  COMPARISON:  07/31/2019  FINDINGS: Pigtail drain in the right lung apex. Adjacent staple line/postsurgical changes.  Small to moderate right apical  pneumothorax, unchanged. Small right basilar pleural fluid.  Mild patchy opacity in the lateral right hemithorax. Left lung is clear.  The heart is normal in size.  IMPRESSION: Small to moderate right apical pneumothorax, unchanged. Associated pigtail drain.  Mild patchy opacities in the right hemithorax with small basilar right pleural fluid, unchanged.   Electronically Signed   By: Charline Bills M.D.   On: 08/01/2019 08:15   Assessment/Plan: S/P Procedure(s) (LRB): VIDEO ASSISTED THORACOSCOPY WITH TALC PLEURODESIS (Right) STAPLING OF BLEBS (Right)  -POD9right VATS, bleb resection for PTX.CT's out 3/10.Path showed "necrotic lung" and "emphysema".   -POD2 drainage of abscess from right chest by IR. The pigtail catheter has drained ~67ml purulent fluid over the past 24 hours. The IR team documented the fluid samples were sent to the lab for cultures and cytology as ordered but there is no indication that these have been processed. Using sterile technique, a right pleural fluid culture was aspirated from the connedtion port of the PleurEvac tubing and sent for Cx and gram stain. WBC improved afte pigtail placement but remains elevated today. Will continue empiric Maxipime.   -Atrial fibrillationConverted back to SR. On oral  Amiodarone 400mg  BID for several days.Will decrease to 200mg  po BID. ContinueEliquis.  -DVT PPX-d/c'dlovenox since now on Eliquis.   LOS: 11 days    , Leary Roca 08/01/2019 Pt seen and examined; agree with PA [place] note. Will consider adding Vancomycin until cultures return. Ciaran Begay Z. [phone], MD [phone]

## 2019-08-02 ENCOUNTER — Inpatient Hospital Stay (HOSPITAL_COMMUNITY): Payer: No Typology Code available for payment source

## 2019-08-02 ENCOUNTER — Ambulatory Visit: Payer: Self-pay | Admitting: Cardiothoracic Surgery

## 2019-08-02 LAB — BASIC METABOLIC PANEL
Anion gap: 11 (ref 5–15)
BUN: 9 mg/dL (ref 6–20)
CO2: 23 mmol/L (ref 22–32)
Calcium: 8.9 mg/dL (ref 8.9–10.3)
Chloride: 100 mmol/L (ref 98–111)
Creatinine, Ser: 0.79 mg/dL (ref 0.61–1.24)
GFR calc Af Amer: >60 mL/min (ref >60)
GFR calc non Af Amer: >60 mL/min (ref >60)
Glucose, Bld: 100 mg/dL — ABNORMAL HIGH (ref 70–99)
Potassium: 4.7 mmol/L (ref 3.5–5.1)
Sodium: 134 mmol/L — ABNORMAL LOW (ref 135–145)

## 2019-08-02 LAB — CBC
HCT: 36.5 % — ABNORMAL LOW (ref 39.0–52.0)
Hemoglobin: 11.9 g/dL — ABNORMAL LOW (ref 13.0–17.0)
MCH: 31.3 pg (ref 26.0–34.0)
MCHC: 32.6 g/dL (ref 30.0–36.0)
MCV: 96.1 fL (ref 80.0–100.0)
Platelets: 560 10*3/uL — ABNORMAL HIGH (ref 150–400)
RBC: 3.8 MIL/uL — ABNORMAL LOW (ref 4.22–5.81)
RDW: 12.5 % (ref 11.5–15.5)
WBC: 25.3 10*3/uL — ABNORMAL HIGH (ref 4.0–10.5)
nRBC: 0 % (ref 0.0–0.2)

## 2019-08-02 LAB — CYTOLOGY - NON PAP

## 2019-08-02 NOTE — Progress Notes (Signed)
10 Days Post-Op Procedure(s) (LRB): VIDEO ASSISTED THORACOSCOPY WITH TALC PLEURODESIS (Right) STAPLING OF BLEBS (Right) Subjective: Complains of persistent right-side chest pain along with his chronic back pain.   Objective: Vital signs in last 24 hours: Temp:  [97.9 F (36.6 C)-98.5 F (36.9 C)] 98.2 F (36.8 C) (03/15 0347) Pulse Rate:  [69-92] 76 (03/15 0347) Cardiac Rhythm: Normal sinus rhythm (03/15 0347) Resp:  [12-28] 17 (03/15 0347) BP: (112-138)/(71-85) 129/77 (03/15 0347) SpO2:  [96 %-98 %] 98 % (03/14 2000)     Intake/Output from previous day: 03/14 0701 - 03/15 0700 In: 950 [P.O.:450; IV Piggyback:500] Out: 3490 [Urine:3450; Chest Tube:40] Intake/Output this shift: No intake/output data recorded.  Physical Exam: General appearance:alert, cooperative and mild distress Neurologic:intact Heart:Remains inSR. Lungs:Breath sounds clear, diminished on right. CXRshows stable right apical PTX and further clearing of the density at upper pole of the right lung. The drainage tube appears to be well positioned.  Wound:the right thoracotomey incisionis now dry.   Lab Results: Recent Labs    08/01/19 0301 08/02/19 0222  WBC 23.1* 25.3*  HGB 12.6* 11.9*  HCT 37.8* 36.5*  PLT 564* 560*   BMET:  Recent Labs    08/01/19 0301 08/02/19 0222  NA 131* 134*  K 4.7 4.7  CL 95* 100  CO2 25 23  GLUCOSE 97 100*  BUN 13 9  CREATININE 0.77 0.79  CALCIUM 9.1 8.9    PT/INR: No results for input(s): LABPROT, INR in the last 72 hours. ABG    Component Value Date/Time   PHART 7.453 (H) 07/24/2019 0515   HCO3 26.7 07/24/2019 0515   TCO2 29 07/21/2019 1102   O2SAT 95.0 07/24/2019 0515   CBG (last 3)  No results for input(s): GLUCAP in the last 72 hours.  Assessment/Plan: S/P Procedure(s) (LRB): VIDEO ASSISTED THORACOSCOPY WITH TALC PLEURODESIS (Right) STAPLING OF BLEBS (Right)  -POD10right VATS, bleb resection for PTX.CT's out 3/10.Path showed "necrotic  lung" and "emphysema".   -POD3 drainage of abscess from right chest by IR. The pigtail catheter has drained ~70ml purulent fluid over the past 24 hours. Gram stain on fluid sent from the chest tube yesterday shows a few WBC'c, no organisms. Cx pending.  WBC improved immediately after pigtail placement but remains elevated today. Vanc added to Maxipime yesterday.  The improvement in the CXR is encouraging.   -Atrial fibrillationConverted back to SR. Continue oral  Amiodarone 200mg  BID for  ContinueEliquis.  -DVT PPX-d/c'dlovenox sincenow onEliquis.   LOS: 12 days    , Leary Roca [place] 08/02/2019

## 2019-08-03 ENCOUNTER — Inpatient Hospital Stay (HOSPITAL_COMMUNITY): Payer: No Typology Code available for payment source

## 2019-08-03 DIAGNOSIS — J939 Pneumothorax, unspecified: Secondary | ICD-10-CM

## 2019-08-03 LAB — BODY FLUID CULTURE

## 2019-08-03 LAB — CBC
HCT: 35.4 % — ABNORMAL LOW (ref 39.0–52.0)
Hemoglobin: 11.5 g/dL — ABNORMAL LOW (ref 13.0–17.0)
MCH: 31.3 pg (ref 26.0–34.0)
MCHC: 32.5 g/dL (ref 30.0–36.0)
MCV: 96.5 fL (ref 80.0–100.0)
Platelets: 610 10*3/uL — ABNORMAL HIGH (ref 150–400)
RBC: 3.67 MIL/uL — ABNORMAL LOW (ref 4.22–5.81)
RDW: 12.5 % (ref 11.5–15.5)
WBC: 22.7 10*3/uL — ABNORMAL HIGH (ref 4.0–10.5)
nRBC: 0 % (ref 0.0–0.2)

## 2019-08-03 LAB — ACID FAST SMEAR (AFB, MYCOBACTERIA): Acid Fast Smear: NEGATIVE

## 2019-08-03 NOTE — Plan of Care (Signed)

## 2019-08-03 NOTE — Progress Notes (Signed)
Social visit  Met with patient, discussed course to date, feeling better today. Appreciate excellent care from Dr. Orvan Seen and his team. Will follow along peripherally.  Erskine Emery MD PCCM

## 2019-08-03 NOTE — Progress Notes (Signed)
      [address]       [place] [address]             [phone]      11 Days Post-Op Procedure(s) (LRB): VIDEO ASSISTED THORACOSCOPY WITH TALC PLEURODESIS (Right) STAPLING OF BLEBS (Right)   Subjective:  Up in chair, continues to have right sided pain.  Feels like he is feeling a little better after placement of drain  Objective: Vital signs in last 24 hours: Temp:  [97.3 F (36.3 C)-98 F (36.7 C)] 97.9 F (36.6 C) (03/16 0410) Pulse Rate:  [72-87] 72 (03/16 0410) Cardiac Rhythm: Normal sinus rhythm (03/16 0704) Resp:  [14-20] 14 (03/16 0410) BP: (76-131)/(50-86) 123/79 (03/16 0410) SpO2:  [98 %-99 %] 98 % (03/16 0410)  Intake/Output from previous day: 03/15 0701 - 03/16 0700 In: 480 [P.O.:480] Out: 3385 [Urine:3355; Chest Tube:30]  General appearance: alert, cooperative and no distress Heart: regular rate and rhythm Lungs: diminished breath sounds bibasilar Abdomen: soft, non-tender; bowel sounds normal; no masses,  no organomegaly Wound: clean and dry  Lab Results: Recent Labs    08/02/19 0222 08/03/19 0111  WBC 25.3* 22.7*  HGB 11.9* 11.5*  HCT 36.5* 35.4*  PLT 560* 610*   BMET:  Recent Labs    08/01/19 0301 08/02/19 0222  NA 131* 134*  K 4.7 4.7  CL 95* 100  CO2 25 23  GLUCOSE 97 100*  BUN 13 9  CREATININE 0.77 0.79  CALCIUM 9.1 8.9    PT/INR: No results for input(s): LABPROT, INR in the last 72 hours. ABG    Component Value Date/Time   PHART 7.453 (H) 07/24/2019 0515   HCO3 26.7 07/24/2019 0515   TCO2 29 07/21/2019 1102   O2SAT 95.0 07/24/2019 0515   CBG (last 3)  No results for input(s): GLUCAP in the last 72 hours.  Assessment/Plan: S/P Procedure(s) (LRB): VIDEO ASSISTED THORACOSCOPY WITH TALC PLEURODESIS (Right) STAPLING OF BLEBS (Right)  1. CV- PAF, maintaining NSR on Amiodarone, Eliquis 2. Pulm- S/P Pigtail placement for area of necrotic lung- drained 30 cc yesterday, 1+ air leak with cough leave in place  today 3. ID- cultures growing Pseudomonas Aeruginosa- on Vanc, Cefipime, leukocytosis improving down to 22, afebrile 4. Dispo- patient stable, order for CXR placed, remains in NSR on Amiodarone, Eliquis, Pigtail in place 30 cc output yesterday, continue ABX repeat CXR in AM   LOS: 13 days    Lowella Dandy, PA-C  08/03/2019

## 2019-08-04 ENCOUNTER — Inpatient Hospital Stay (HOSPITAL_COMMUNITY): Payer: No Typology Code available for payment source

## 2019-08-04 MED ORDER — LEVOFLOXACIN 750 MG PO TABS
750.0000 mg | ORAL_TABLET | Freq: Every day | ORAL | Status: DC
  Administered 2019-08-04: 750 mg via ORAL
  Filled 2019-08-04: qty 1

## 2019-08-04 MED ORDER — CIPROFLOXACIN HCL 500 MG PO TABS
500.0000 mg | ORAL_TABLET | Freq: Two times a day (BID) | ORAL | Status: DC
  Administered 2019-08-04: 500 mg via ORAL
  Filled 2019-08-04: qty 1

## 2019-08-04 NOTE — Progress Notes (Signed)
Changed chest tube sahara to pneumostat and secured via clip

## 2019-08-04 NOTE — Plan of Care (Signed)

## 2019-08-04 NOTE — Progress Notes (Signed)
      [address]       [place] [address]             [phone]      12 Days Post-Op Procedure(s) (LRB): VIDEO ASSISTED THORACOSCOPY WITH TALC PLEURODESIS (Right) STAPLING OF BLEBS (Right)   Subjective:  Patient up in chair.  No complaints  Objective: Vital signs in last 24 hours: Temp:  [97.7 F (36.5 C)-98.7 F (37.1 C)] 97.8 F (36.6 C) (03/17 0735) Pulse Rate:  [70-90] 73 (03/17 0735) Cardiac Rhythm: Normal sinus rhythm (03/17 0735) Resp:  [14-19] 19 (03/17 0735) BP: (102-139)/(57-92) 139/92 (03/17 0735) SpO2:  [91 %-99 %] 99 % (03/17 0735)  Intake/Output from previous day: 03/16 0701 - 03/17 0700 In: 2365.9 [P.O.:600; IV Piggyback:1765.9] Out: 1860 [Urine:1850; Chest Tube:10] Intake/Output this shift: Total I/O In: 340 [P.O.:240; IV Piggyback:100] Out: 750 [Urine:750]  General appearance: alert, cooperative and no distress Heart: regular rate and rhythm Lungs: clear to auscultation bilaterally Abdomen: soft, non-tender; bowel sounds normal; no masses,  no organomegaly Extremities: extremities normal, atraumatic, no cyanosis or edema Wound: clean and dry  Lab Results: Recent Labs    08/02/19 0222 08/03/19 0111  WBC 25.3* 22.7*  HGB 11.9* 11.5*  HCT 36.5* 35.4*  PLT 560* 610*   BMET:  Recent Labs    08/02/19 0222  NA 134*  K 4.7  CL 100  CO2 23  GLUCOSE 100*  BUN 9  CREATININE 0.79  CALCIUM 8.9    PT/INR: No results for input(s): LABPROT, INR in the last 72 hours. ABG    Component Value Date/Time   PHART 7.453 (H) 07/24/2019 0515   HCO3 26.7 07/24/2019 0515   TCO2 29 07/21/2019 1102   O2SAT 95.0 07/24/2019 0515   CBG (last 3)  No results for input(s): GLUCAP in the last 72 hours.  Assessment/Plan: S/P Procedure(s) (LRB): VIDEO ASSISTED THORACOSCOPY WITH TALC PLEURODESIS (Right) STAPLING OF BLEBS (Right)  1. CV- PAF, has been maintaining NSR- continue Amiodarone, Eliquis 2. Pulm- pigtail in place, CXR with  improvement of abscess collection, + air leak, no pneumothorax... minimal output, will discuss timing of tube removal with Dr. Vickey Sages 3. ID- + Pseudomonas Aeruginosa- continue Cefipime, will transition to Cipro at discharge 4. Dispo- patient stable, CXR much improved, remains afebrile, leukocytosis improving.. will discuss CT removal with Dr. Vickey Sages   LOS: 14 days    Lowella Dandy, PA-C  08/04/2019

## 2019-08-05 ENCOUNTER — Other Ambulatory Visit: Payer: Self-pay | Admitting: Cardiothoracic Surgery

## 2019-08-05 ENCOUNTER — Inpatient Hospital Stay (HOSPITAL_COMMUNITY): Payer: No Typology Code available for payment source

## 2019-08-05 DIAGNOSIS — Z09 Encounter for follow-up examination after completed treatment for conditions other than malignant neoplasm: Secondary | ICD-10-CM

## 2019-08-05 LAB — CBC
HCT: 39.8 % (ref 39.0–52.0)
Hemoglobin: 12.7 g/dL — ABNORMAL LOW (ref 13.0–17.0)
MCH: 31.1 pg (ref 26.0–34.0)
MCHC: 31.9 g/dL (ref 30.0–36.0)
MCV: 97.5 fL (ref 80.0–100.0)
Platelets: 761 10*3/uL — ABNORMAL HIGH (ref 150–400)
RBC: 4.08 MIL/uL — ABNORMAL LOW (ref 4.22–5.81)
RDW: 12.2 % (ref 11.5–15.5)
WBC: 18.9 10*3/uL — ABNORMAL HIGH (ref 4.0–10.5)
nRBC: 0 % (ref 0.0–0.2)

## 2019-08-05 MED ORDER — METOPROLOL TARTRATE 25 MG PO TABS: 12.5000 mg | ORAL_TABLET | Freq: Two times a day (BID) | ORAL | 3 refills | Status: AC

## 2019-08-05 MED ORDER — LEVOFLOXACIN 750 MG PO TABS: 750.0000 mg | ORAL_TABLET | Freq: Every day | ORAL | 0 refills | Status: AC

## 2019-08-05 MED ORDER — OXYCODONE HCL 5 MG PO TABS: 5.0000 mg | ORAL_TABLET | ORAL | 0 refills | Status: DC | PRN

## 2019-08-05 MED ORDER — LEVOFLOXACIN 750 MG PO TABS: 750.0000 mg | ORAL_TABLET | Freq: Every day | ORAL | 0 refills | Status: DC

## 2019-08-05 MED ORDER — APIXABAN 5 MG PO TABS: 5.0000 mg | ORAL_TABLET | Freq: Two times a day (BID) | ORAL | 3 refills | Status: AC

## 2019-08-05 MED ORDER — AMIODARONE HCL 200 MG PO TABS: 200.0000 mg | ORAL_TABLET | Freq: Every day | ORAL | 1 refills | Status: AC

## 2019-08-05 MED ORDER — OXYCODONE HCL 5 MG PO TABS: 5.0000 mg | ORAL_TABLET | ORAL | 0 refills | Status: AC | PRN

## 2019-08-05 MED ORDER — AMIODARONE HCL 200 MG PO TABS: 200.0000 mg | ORAL_TABLET | Freq: Every day | ORAL | 1 refills | Status: DC

## 2019-08-05 MED ORDER — APIXABAN 5 MG PO TABS: 5.0000 mg | ORAL_TABLET | Freq: Two times a day (BID) | ORAL | 3 refills | Status: DC

## 2019-08-05 MED FILL — ELIQUIS 5 MG TABLET: 5 | 30 days supply | Qty: 60 | Fill #0

## 2019-08-05 NOTE — TOC Transition Note (Signed)
Transition of Care [facility]) - CM/SW Discharge Note   Patient Details  Name: Caleb Young. MRN: [id number] Date of Birth: 09/22/1965  [facility]) CM/SW Contact:  Leone Haven, RN Phone Number: 08/05/2019, 9:41 AM   Clinical Narrative:    NCM [facility] and notified Lupita Leash with [facility] to cancel, patient states he knows how to drain the pigtail to pnuemo stat and then flush it down the toilet.  His wife is in the medical field and she also will be able to assist. Patient is for dc today.  NCM spoke with Denny Peon the PA also and notified her of this information, she states if patient feels comfortable doing it then he does not need a HHRN.     Final next level of care: Home/Self Care Barriers to Discharge: No Barriers Identified   Patient Goals and CMS Choice Patient states their goals for this hospitalization and ongoing recovery are:: to get better CMS Medicare.gov Compare Post Acute Care list provided to:: Patient Represenative (must comment) Choice offered to / list presented to : Spouse  Discharge Placement                       Discharge Plan and Services   Discharge Planning Services: CM Consult            DME Arranged: (NA)                    Social Determinants of Health (SDOH) Interventions     Readmission Risk Interventions No flowsheet data found.

## 2019-08-05 NOTE — Progress Notes (Signed)
      [address]       [place] [address]             [phone]      13 Days Post-Op Procedure(s) (LRB): VIDEO ASSISTED THORACOSCOPY WITH TALC PLEURODESIS (Right) STAPLING OF BLEBS (Right)   Subjective:  No new complaints.  Feels pretty good.  Objective: Vital signs in last 24 hours: Temp:  [97.7 F (36.5 C)-98.4 F (36.9 C)] 97.8 F (36.6 C) (03/18 0740) Pulse Rate:  [69-74] 72 (03/18 0740) Cardiac Rhythm: Normal sinus rhythm (03/18 0705) Resp:  [16-20] 20 (03/18 0740) BP: (83-137)/(61-88) 119/77 (03/18 0740) SpO2:  [96 %-99 %] 97 % (03/18 0440)  Intake/Output from previous day: 03/17 0701 - 03/18 0700 In: 580 [P.O.:480; IV Piggyback:100] Out: 770 [Urine:750; Chest Tube:20]  General appearance: alert, cooperative and no distress Heart: regular rate and rhythm Lungs: clear to auscultation bilaterally Abdomen: soft, non-tender; bowel sounds normal; no masses,  no organomegaly Extremities: extremities normal, atraumatic, no cyanosis or edema Wound: clean and dry  Lab Results: Recent Labs    08/03/19 0111 08/05/19 0234  WBC 22.7* 18.9*  HGB 11.5* 12.7*  HCT 35.4* 39.8  PLT 610* 761*   BMET: No results for input(s): NA, K, CL, CO2, GLUCOSE, BUN, CREATININE, CALCIUM in the last 72 hours.  PT/INR: No results for input(s): LABPROT, INR in the last 72 hours. ABG    Component Value Date/Time   PHART 7.453 (H) 07/24/2019 0515   HCO3 26.7 07/24/2019 0515   TCO2 29 07/21/2019 1102   O2SAT 95.0 07/24/2019 0515   CBG (last 3)  No results for input(s): GLUCAP in the last 72 hours.  Assessment/Plan: S/P Procedure(s) (LRB): VIDEO ASSISTED THORACOSCOPY WITH TALC PLEURODESIS (Right) STAPLING OF BLEBS (Right)  1. CV- PAF, maintaining NSR- continue Amiodarone, Eliquis 2. Pulm- pigtal remains in place, CXR remains stable, output has been minimal 3. ID- + pseudomonal aeruginosa- will transition to Levoquin for discharge as discussed with pharmacy  4. Dispo- patient stable, will d/c home today   LOS: 15 days    Lowella Dandy, PA-C 08/05/2019

## 2019-08-05 NOTE — TOC Progression Note (Addendum)
Transition of Care (TOC) - Progression Note    Patient Details  Name: Caleb Young. MRN: [id number] Date of Birth: [birth year]  Transition of Care [facility]) CM/SW Contact  Leone Haven, RN Phone Number: 08/05/2019, 9:29 AM  Clinical Narrative:    Patient is for dc today, NCM offered choice for [facility] , he states his wife will have to choose, he states to call her at [phone].  NCM contacted wife ,she states to go with [facility] for [facility] and if they can not then try [facility] and [facility] etc. NCM made referral to Lupita Leash with [facility] for [facility],  Awaiting call back.  [facility] also awaiting call back, will need to get authorization for [facility].  Patient states he knows how to drain the pigtail to pneomo stat so he does not need a HHRN.  NCM informed Lupita Leash with [facility] to cancel referral.     Expected Discharge Plan: Home/Self Care Barriers to Discharge: Continued Medical Work up  Expected Discharge Plan and Services Expected Discharge Plan: Home/Self Care   Discharge Planning Services: CM Consult   Living arrangements for the past 2 months: Mobile Home Expected Discharge Date: 08/05/19                                     Social Determinants of Health (SDOH) Interventions    Readmission Risk Interventions No flowsheet data found.

## 2019-08-09 ENCOUNTER — Ambulatory Visit
Admission: RE | Admit: 2019-08-09 | Discharge: 2019-08-09 | Disposition: A | Discharge status: Home / Self Care | Payer: No Typology Code available for payment source | Source: Ambulatory Visit | Attending: Cardiothoracic Surgery | Admitting: Cardiothoracic Surgery

## 2019-08-09 ENCOUNTER — Other Ambulatory Visit: Payer: Self-pay

## 2019-08-09 ENCOUNTER — Ambulatory Visit (INDEPENDENT_AMBULATORY_CARE_PROVIDER_SITE_OTHER): Payer: Self-pay | Admitting: Cardiothoracic Surgery

## 2019-08-09 VITALS — BP 126/76 | HR 76 | Temp 97.6°F | Resp 20 | Ht 76.0 in | Wt 161.0 lb

## 2019-08-09 DIAGNOSIS — J939 Pneumothorax, unspecified: Secondary | ICD-10-CM

## 2019-08-09 DIAGNOSIS — Z09 Encounter for follow-up examination after completed treatment for conditions other than malignant neoplasm: Secondary | ICD-10-CM

## 2019-08-10 ENCOUNTER — Telehealth: Payer: Self-pay

## 2019-08-10 NOTE — Progress Notes (Signed)
[address]       ,[place] [address]             [phone]     CARDIOTHORACIC SURGERY OFFICE NOTE  Referring Provider is Candee Furbish, MD Primary Cardiologist is No primary care provider on file. [facility]   HPI:  54 yo man presented with spont R PtX. This was originally managed with pigtail catheter insertion but his PTX persisted. He underwent R VATS bleb stapling of apical portion of RUL and pleurodesis. Postoperatively, he had a residual space at apex of right chest and elevated WBC. This required percutaneous drainage and he was discharged home on abx with the drain. He now presents for f/u. Feeling much better, denies cough, f/c or sob. There is mild pain associated with the drain. The drainage is tabulated and is scant and nonpurulent   Current Outpatient Medications  Medication Sig Dispense Refill  . amiodarone (PACERONE) 200 MG tablet Take 1 tablet (200 mg total) by mouth daily. 30 tablet 1  . apixaban (ELIQUIS) 5 MG TABS tablet Take 1 tablet (5 mg total) by mouth 2 (two) times daily. 60 tablet 3  . baclofen (LIORESAL) 10 MG tablet Take 5 mg by mouth at bedtime.    . Ensure (ENSURE) Take 237 mLs by mouth 3 (three) times daily between meals.    Marland Kitchen levofloxacin (LEVAQUIN) 750 MG tablet Take 1 tablet (750 mg total) by mouth daily. 8 tablet 0  . loperamide (IMODIUM) 2 MG capsule Take 2 mg by mouth as needed for diarrhea or loose stools.    Marland Kitchen losartan (COZAAR) 25 MG tablet Take 12.5 mg by mouth daily.    . metoprolol tartrate (LOPRESSOR) 25 MG tablet Take 0.5 tablets (12.5 mg total) by mouth 2 (two) times daily. 30 tablet 3  . oxyCODONE (OXY IR/ROXICODONE) 5 MG immediate release tablet Take 1-2 tablets (5-10 mg total) by mouth every 4 (four) hours as needed for moderate pain. 30 tablet 0  . tiZANidine (ZANAFLEX) 4 MG tablet Take 4 mg by mouth at bedtime.      No current facility-administered medications for this visit.       Physical Exam:   BP 126/76   Pulse 76   Temp 97.6 F (36.4 C) (Skin)   Resp 20   Ht 6\' 4"  (1.93 m)   Wt 73 kg   SpO2 96% Comment: RA  BMI 19.60 kg/m   General:  Well-appearing, NAD  Chest:   cta  CV:   rrr  Incisions:  C/d/i  Abdomen:  sntnd  Extremities:  No edema; well perfused The pigtail catheter is removed intact and without difficulty  Diagnostic Tests:  CXR with small space at apex of right chest  Impression:  Making good progress after recent admit for right PTX and VATS   Plan:  F/u in one week with repeat CXR Report any sx of SOB/DOE that are worsening, in addition to f/c or other respiratory difficulties  I spent in excess of 20 minutes during the conduct of this office consultation and >50% of this time involved direct face-to-face encounter with the patient for counseling and/or coordination of their care.  Level 2                 10 minutes Level 3                 15 minutes Level 4  25 minutes Level 5                 40 minutes  B. Lorayne Marek, MD 08/10/2019 11:55 AM

## 2019-08-10 NOTE — Telephone Encounter (Signed)
Pt's wife called to report that pt's Lopressor Rx was not received by the [place] [facility] d/c. Requests [facility]. Per E. Barrett's prescription, called the following in to [facility]: Lopressor 25 mg Take 1/2 tab (12.5 mg) twice daily, #30 w/ 3 refills. Wife aware.

## 2019-08-11 LAB — MISC LABCORP TEST (SEND OUT): Labcorp test code: 183111

## 2019-08-13 ENCOUNTER — Other Ambulatory Visit: Payer: Self-pay | Admitting: Cardiothoracic Surgery

## 2019-08-13 DIAGNOSIS — J939 Pneumothorax, unspecified: Secondary | ICD-10-CM

## 2019-08-16 ENCOUNTER — Ambulatory Visit: Payer: No Typology Code available for payment source | Admitting: Cardiothoracic Surgery

## 2019-08-16 ENCOUNTER — Other Ambulatory Visit: Payer: Self-pay

## 2019-08-16 ENCOUNTER — Encounter: Payer: Self-pay | Admitting: Cardiothoracic Surgery

## 2019-08-16 ENCOUNTER — Ambulatory Visit (INDEPENDENT_AMBULATORY_CARE_PROVIDER_SITE_OTHER): Payer: Self-pay | Admitting: Cardiothoracic Surgery

## 2019-08-16 ENCOUNTER — Ambulatory Visit
Admission: RE | Admit: 2019-08-16 | Discharge: 2019-08-16 | Disposition: A | Discharge status: Home / Self Care | Payer: No Typology Code available for payment source | Source: Ambulatory Visit | Attending: Cardiothoracic Surgery | Admitting: Cardiothoracic Surgery

## 2019-08-16 VITALS — BP 131/81 | HR 71 | Temp 98.4°F | Resp 20 | Ht 76.0 in | Wt 163.0 lb

## 2019-08-16 DIAGNOSIS — J939 Pneumothorax, unspecified: Secondary | ICD-10-CM

## 2019-08-16 DIAGNOSIS — Z09 Encounter for follow-up examination after completed treatment for conditions other than malignant neoplasm: Secondary | ICD-10-CM

## 2019-08-16 MED ORDER — OXYCODONE HCL 5 MG PO TABS: 5.0000 mg | ORAL_TABLET | Freq: Three times a day (TID) | ORAL | 0 refills | Status: DC | PRN

## 2019-08-16 MED ORDER — OXYCODONE HCL 5 MG PO TABS: 5.0000 mg | ORAL_TABLET | Freq: Three times a day (TID) | ORAL | 0 refills | Status: AC | PRN

## 2019-08-16 NOTE — Progress Notes (Signed)
[address]       [name] [address]             [phone]     CARDIOTHORACIC SURGERY OFFICE NOTE  Referring Provider is Lyn Records, MD Primary Cardiologist is No primary care provider on file. [facility]   HPI:  54 yo man s/p right vats for persistent PTX which ultimately was associated with empyema. He had bleb resection of RUL then required pigtail catheter drainage of infected space which has responded well to abx. The catheter was removed from apical space last week and he returns for f/u. No complaints of f/c.    Current Outpatient Medications  Medication Sig Dispense Refill  . amiodarone (PACERONE) 200 MG tablet Take 1 tablet (200 mg total) by mouth daily. 30 tablet 1  . apixaban (ELIQUIS) 5 MG TABS tablet Take 1 tablet (5 mg total) by mouth 2 (two) times daily. 60 tablet 3  . baclofen (LIORESAL) 10 MG tablet Take 5 mg by mouth at bedtime.    . Ensure (ENSURE) Take 237 mLs by mouth 3 (three) times daily between meals.    Marland Kitchen levofloxacin (LEVAQUIN) 750 MG tablet Take 1 tablet (750 mg total) by mouth daily. 8 tablet 0  . loperamide (IMODIUM) 2 MG capsule Take 2 mg by mouth as needed for diarrhea or loose stools.    Marland Kitchen losartan (COZAAR) 25 MG tablet Take 12.5 mg by mouth daily.    . metoprolol tartrate (LOPRESSOR) 25 MG tablet Take 0.5 tablets (12.5 mg total) by mouth 2 (two) times daily. 30 tablet 3  . oxyCODONE (OXY IR/ROXICODONE) 5 MG immediate release tablet Take 1-2 tablets (5-10 mg total) by mouth every 4 (four) hours as needed for moderate pain. 30 tablet 0  . tiZANidine (ZANAFLEX) 4 MG tablet Take 4 mg by mouth at bedtime.     Marland Kitchen oxyCODONE (ROXICODONE) 5 MG immediate release tablet Take 1 tablet (5 mg total) by mouth every 8 (eight) hours as needed. 50 tablet 0   No current facility-administered medications for this visit.      Physical Exam:   BP 131/81   Pulse 71   Temp 98.4 F (36.9 C) (Skin)   Resp 20   Ht 6'  4" (1.93 m)   Wt 73.9 kg   SpO2 98% Comment: RA  BMI 19.84 kg/m   General:  Well-appearing, NAD  Chest:   Decreased BS right base  CV:   rrr  Incisions:  Well-healed   Extremities:  No edema  Diagnostic Tests:  CXR: decreased right apical space   Impression:  Steady progress after Right vats for empyema  Plan:  F/u as needed Prescription give for roxicodone 5 mg prn #50 no refills  I spent in excess of 20 minutes during the conduct of this office consultation and >50% of this time involved direct face-to-face encounter with the patient for counseling and/or coordination of their care.  Level 2                 10 minutes Level 3                 15 minutes Level 4                 25 minutes Level 5                 40 minutes  B. Lorayne Marek, MD 08/16/2019 3:09 PM

## 2019-08-22 LAB — CULTURE, FUNGUS WITHOUT SMEAR

## 2019-09-14 LAB — ACID FAST CULTURE WITH REFLEXED SENSITIVITIES (MYCOBACTERIA): Acid Fast Culture: NEGATIVE

## 2019-09-25 ENCOUNTER — Other Ambulatory Visit: Payer: Self-pay | Admitting: Physician Assistant

## 2021-10-21 IMAGING — DX DG CHEST 1V PORT
1 series · 1 of 1 positions shown · non-contrast
Comparison: CT chest dated 07/30/2019

CLINICAL DATA: Postop check

EXAM:
PORTABLE CHEST 1 VIEW

[chest ap]
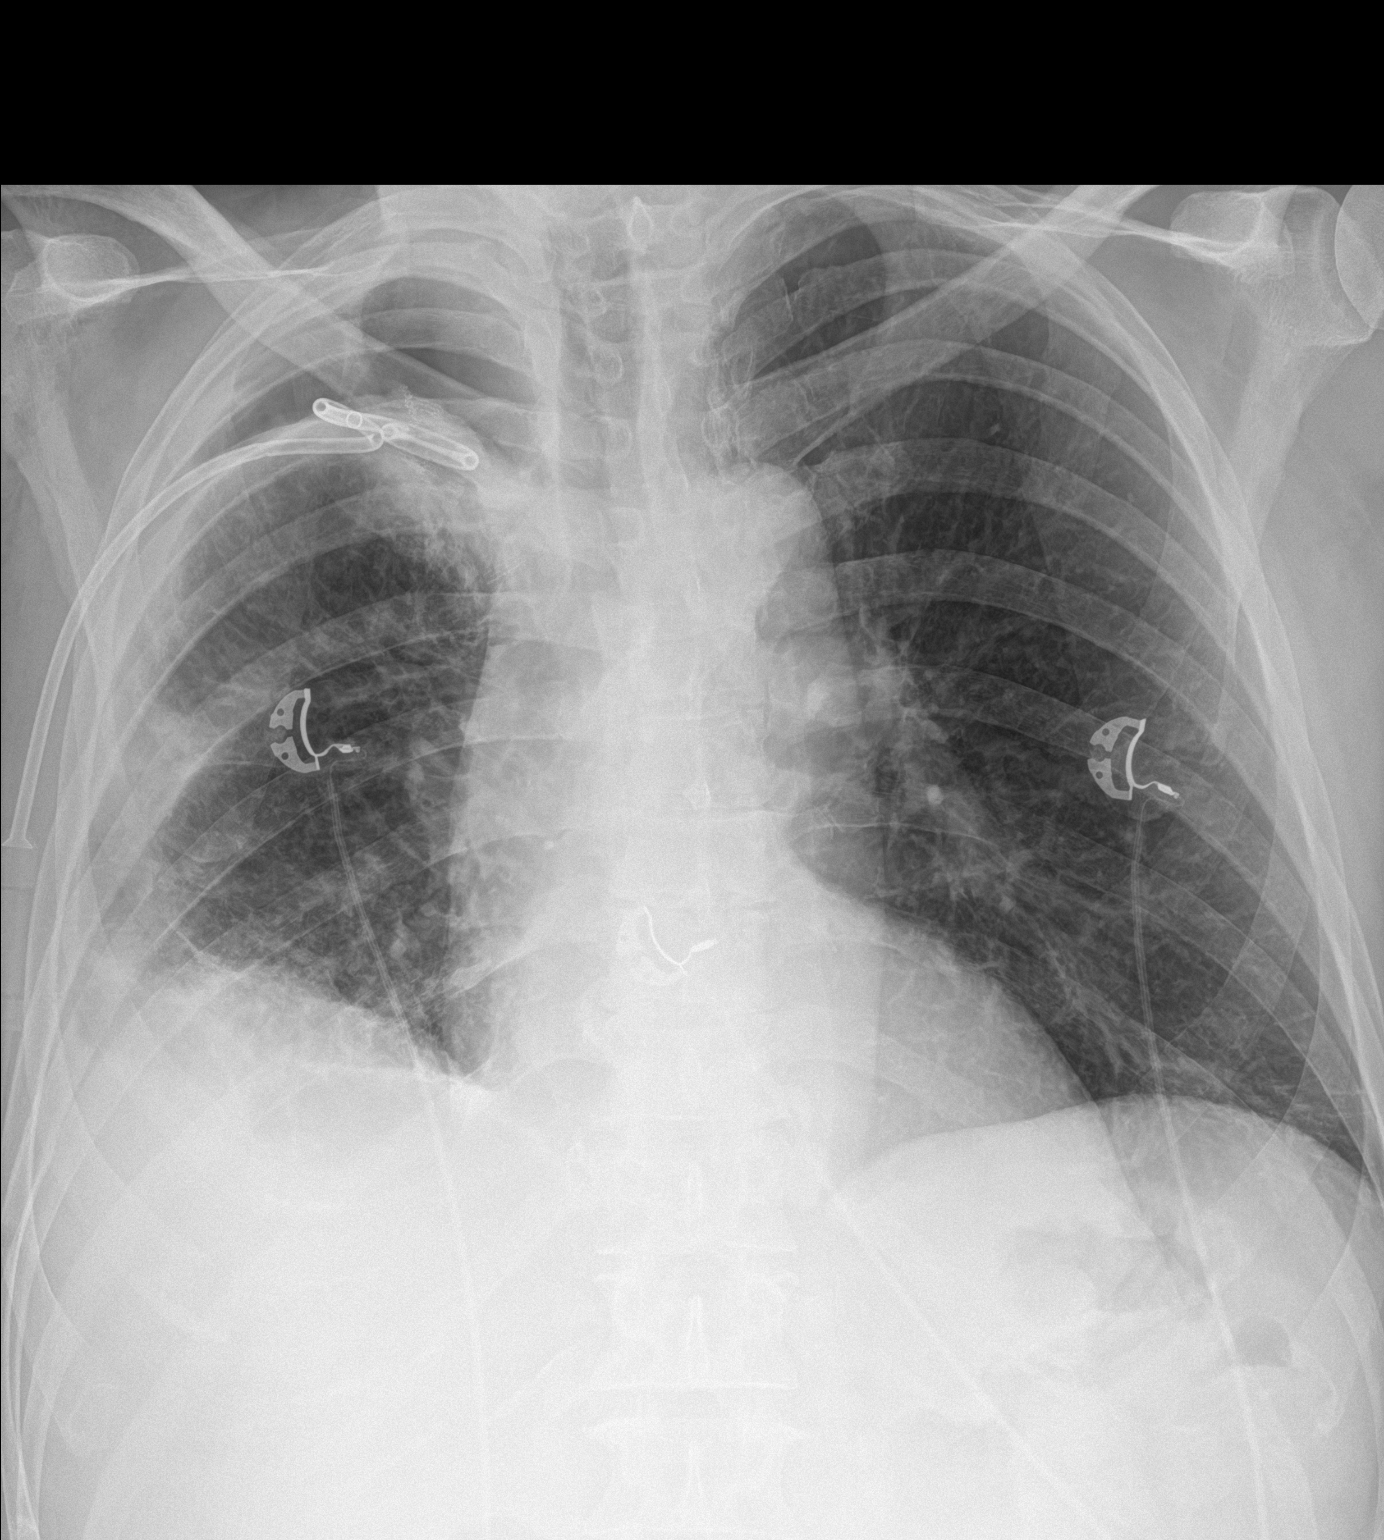

[1 of 1 positions shown; findings below may reference images not displayed]

FINDINGS: Postsurgical changes in the right upper hemithorax with associated
interval placement of a pigtail drain. Prior fluid in the apical
pleural space has resolved. However, there is a small apical
pneumothorax. Trace basilar pleural fluid.

Additional mild patchy right upper and lower lobe opacities,
subpleural/peripheral, likely atelectasis. Left lung is clear.

The heart is normal in size.
IMPRESSION: Postsurgical changes in the right hemithorax with interval placement
of an apical pigtail drain. Prior apical pleural fluid has resolved.

Small right apical pneumothorax.  Trace basilar pleural fluid.

Mild patchy opacities in the right hemithorax, likely atelectasis.

## 2021-10-30 IMAGING — DX DG CHEST 2V
2 series · 2 of 2 positions shown · non-contrast
Comparison: Chest x-rays dated 08/05/2019 and 08/04/2019

CLINICAL DATA: Status post video-assisted thoracoscopy with talc
pleurodesis and stapling of blebs.

EXAM:
CHEST - 2 VIEW

[dg chest 2 view (1 of 2)]
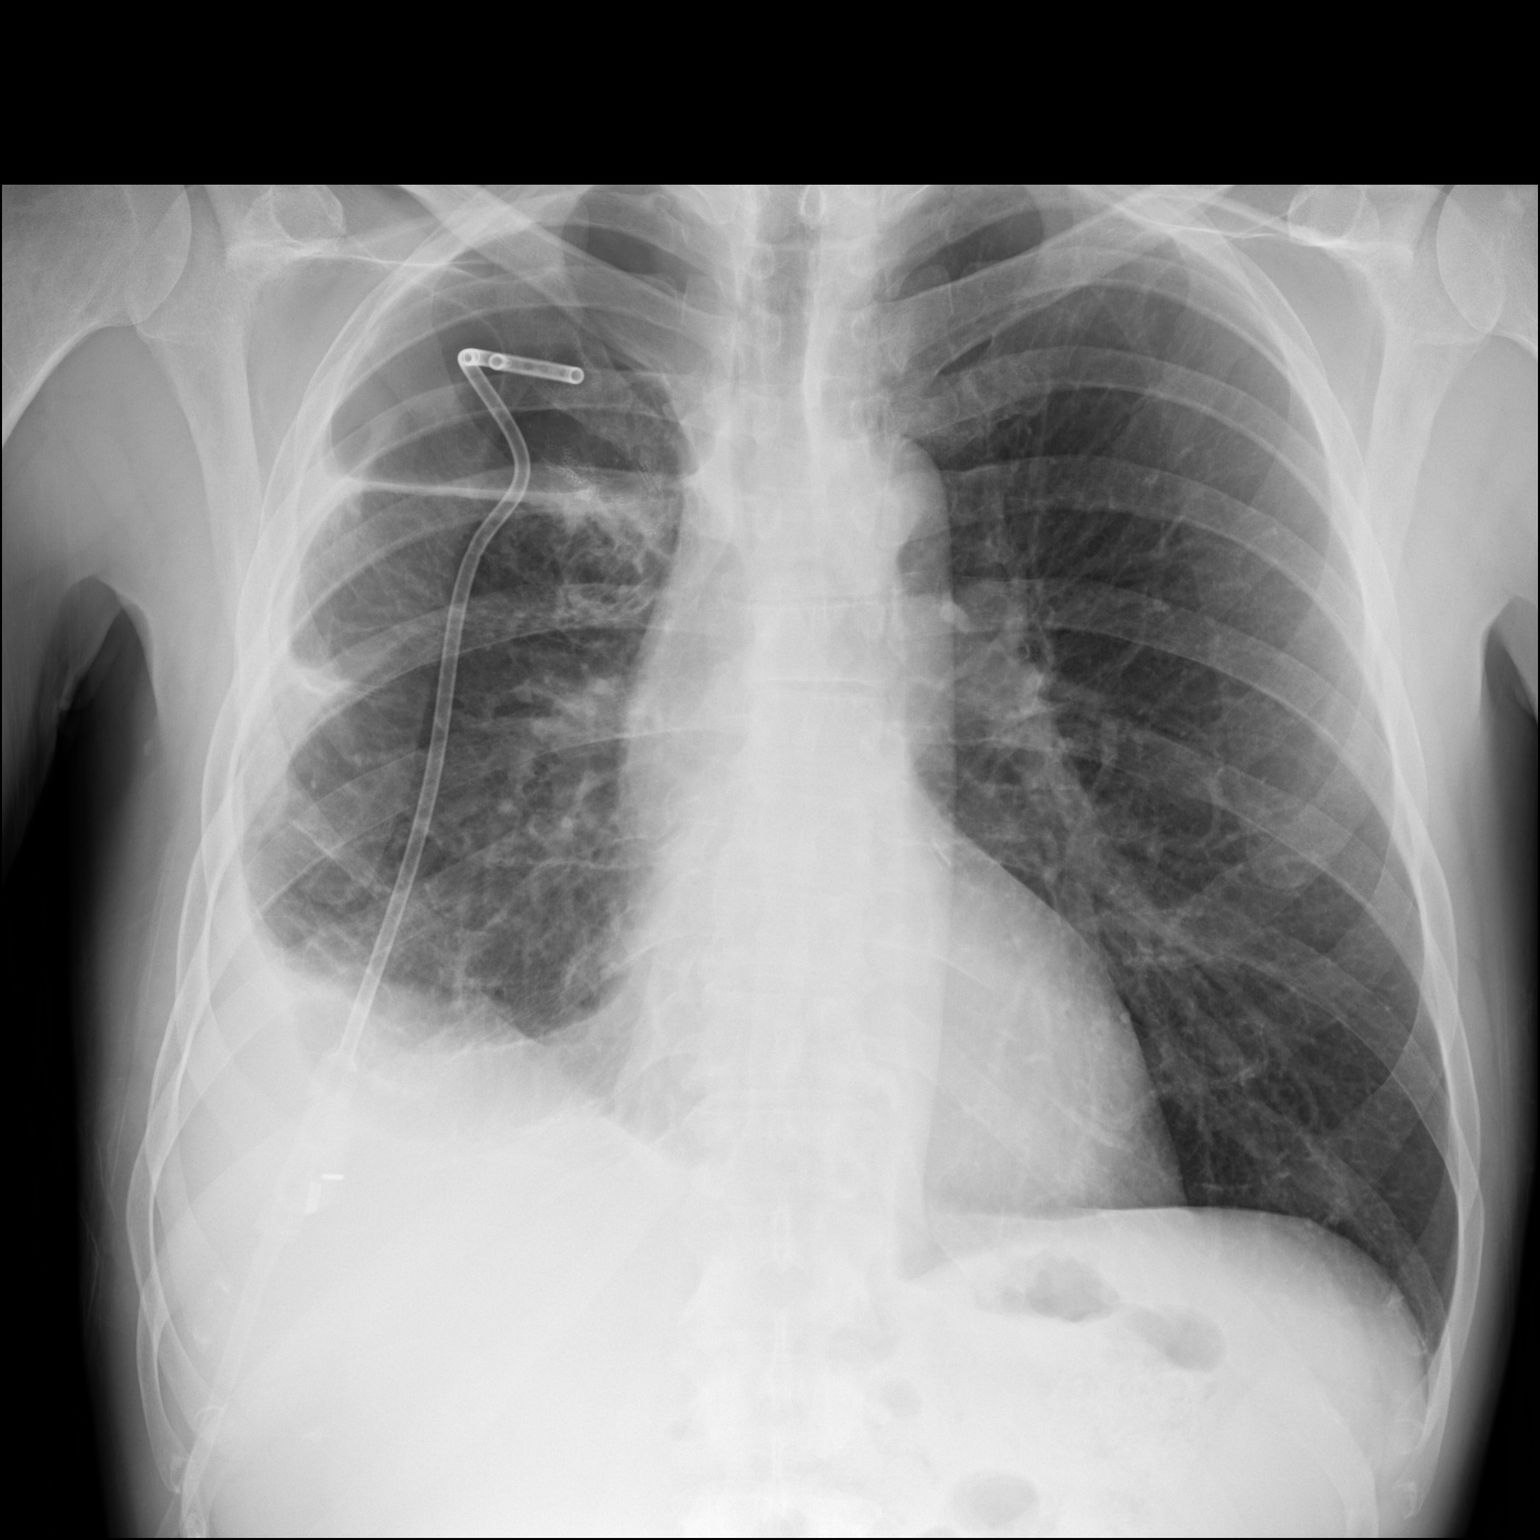

[dg chest 2 view (2 of 2)]
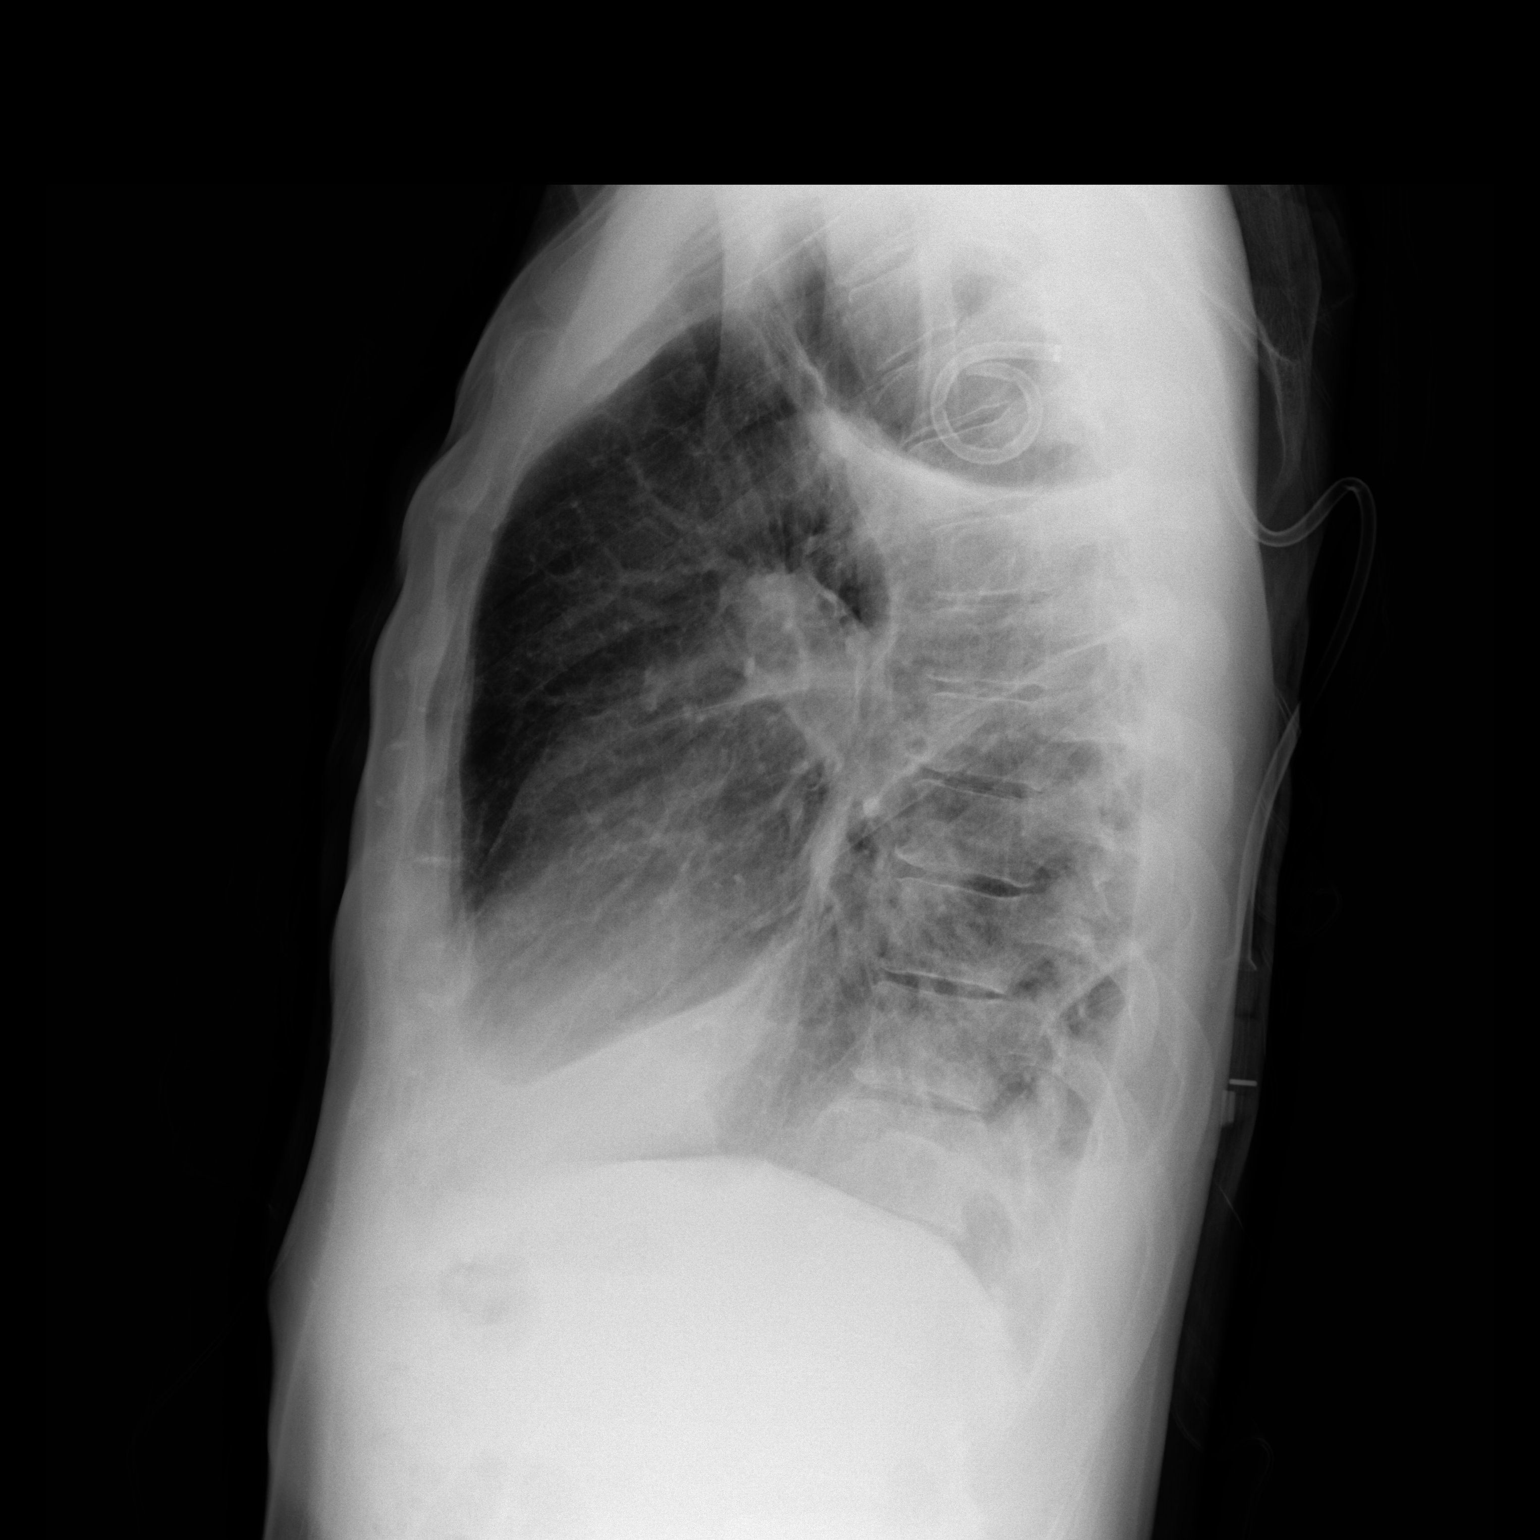

[2 of 2 positions shown; findings below may reference images not displayed]

FINDINGS: Pigtail catheter remains in the apex of the right hemithorax. Stable
loculated pneumothorax at the right apex with no significant
residual fluid in this area.

Stable loculated pleural effusion at the right lung base. Heart size
and vascularity are normal. Left lung is clear. No bone abnormality.
IMPRESSION: No significant change in the loculated pneumothorax at the right
apex. Stable loculated effusion at the right lung base.
